# Patient Record
Sex: Female | Born: 1990 | Hispanic: No | Marital: Married | Smoking: Never smoker
Health system: Southern US, Community
[De-identification: ages and names within clinical notes are randomized; demographics above are authoritative.]

## PROBLEM LIST (undated history)

## (undated) ENCOUNTER — Inpatient Hospital Stay (HOSPITAL_COMMUNITY): Payer: Self-pay

## (undated) DIAGNOSIS — O139 Gestational [pregnancy-induced] hypertension without significant proteinuria, unspecified trimester: Secondary | ICD-10-CM

## (undated) DIAGNOSIS — F329 Major depressive disorder, single episode, unspecified: Secondary | ICD-10-CM

## (undated) HISTORY — PX: NO PAST SURGERIES: SHX2092

---

## 2014-03-04 ENCOUNTER — Ambulatory Visit (INDEPENDENT_AMBULATORY_CARE_PROVIDER_SITE_OTHER): Payer: Medicaid Other | Admitting: Family Medicine

## 2014-03-04 DIAGNOSIS — F32A Depression, unspecified: Secondary | ICD-10-CM

## 2014-03-04 DIAGNOSIS — R51 Headache: Secondary | ICD-10-CM

## 2014-03-04 DIAGNOSIS — K5909 Other constipation: Secondary | ICD-10-CM

## 2014-03-04 DIAGNOSIS — Z30013 Encounter for initial prescription of injectable contraceptive: Secondary | ICD-10-CM

## 2014-03-04 DIAGNOSIS — Z0289 Encounter for other administrative examinations: Secondary | ICD-10-CM

## 2014-03-04 DIAGNOSIS — F329 Major depressive disorder, single episode, unspecified: Secondary | ICD-10-CM

## 2014-03-04 DIAGNOSIS — Z308 Encounter for other contraceptive management: Secondary | ICD-10-CM

## 2014-03-04 DIAGNOSIS — R519 Headache, unspecified: Secondary | ICD-10-CM

## 2014-03-04 DIAGNOSIS — Z008 Encounter for other general examination: Secondary | ICD-10-CM

## 2014-03-04 DIAGNOSIS — K219 Gastro-esophageal reflux disease without esophagitis: Secondary | ICD-10-CM

## 2014-03-04 LAB — POCT URINE PREGNANCY: Preg Test, Ur: NEGATIVE

## 2014-03-04 MED ORDER — OMEPRAZOLE 40 MG PO CPDR: 40.0000 mg | DELAYED_RELEASE_CAPSULE | Freq: Every day | ORAL | Status: DC

## 2014-03-04 MED ORDER — DOCUSATE SODIUM 100 MG PO CAPS: 100.0000 mg | ORAL_CAPSULE | Freq: Two times a day (BID) | ORAL | Status: DC

## 2014-03-04 MED ORDER — ACETAMINOPHEN 500 MG PO TABS: 500.0000 mg | ORAL_TABLET | Freq: Four times a day (QID) | ORAL | Status: DC | PRN

## 2014-03-04 NOTE — Progress Notes (Signed)
   [facility] New Patient Visit  HPI:  [facility] for a new patient appointment to establish general primary care.  See problem list for other details  ROS: See HPI  Immigrant Social History: - Name spelling correct?:  - Date arrived in [place]: 14 December 2013.   - Country of origin: [place] - Location of refugee camp (if applicable), how long there, and what caused patient to leave home country?: camp in [place] - Primary language:  Speaks English - Education: Highest level of education: WriterBachelor's Information Technology - Prior work: fled to [place] as soon as she finished school - Tobacco/alcohol/drug use: none - Marriage Status: none - Sexual activity: vaginal, uses condoms - Declined discussion about reasons for fleeing [place].  Denies any PTSD symptoms.    Preventative Care History: -Seen at health department?: no  Past Medical Hx:  -denies  Past Surgical Hx:  -denies  Family Hx: updated in Epic No family members here in [place]  PHYSICAL EXAM:  Gen: Well NAD HEENT: EOMI,  MMM Lungs: CTABL Nl WOB Heart: RRR no MRG Abd:  BS good.  Some mild TTP LLQ and suprapubic Exts: Non edematous BL  LE, warm and well perfused.  Psych:  Not depressed or anxious appearing.  Linear and coherent thought process as evidenced by speech pattern. Smiles spontaneously.  Neuro:  Alert and oriented to person, place, and date.  CN II-XII intact.  No focal deficits noted.

## 2014-03-04 NOTE — Patient Instructions (Signed)
Call to make a lab appointment on Tuesday of next week.  Take the Colace twice daily for your stomach.  Take the Omeprazole once a day for your stomach.  Take the Acetaminophen for headaches every 6 hours.  I'll see you back in 4 weeks.

## 2014-03-05 DIAGNOSIS — K5900 Constipation, unspecified: Secondary | ICD-10-CM | POA: Insufficient documentation

## 2014-03-05 DIAGNOSIS — F329 Major depressive disorder, single episode, unspecified: Secondary | ICD-10-CM | POA: Insufficient documentation

## 2014-03-05 DIAGNOSIS — Z309 Encounter for contraceptive management, unspecified: Secondary | ICD-10-CM | POA: Insufficient documentation

## 2014-03-05 DIAGNOSIS — F32A Depression, unspecified: Secondary | ICD-10-CM | POA: Insufficient documentation

## 2014-03-05 DIAGNOSIS — K219 Gastro-esophageal reflux disease without esophagitis: Secondary | ICD-10-CM | POA: Insufficient documentation

## 2014-03-05 DIAGNOSIS — R51 Headache: Secondary | ICD-10-CM

## 2014-03-05 DIAGNOSIS — R519 Headache, unspecified: Secondary | ICD-10-CM | POA: Insufficient documentation

## 2014-03-05 DIAGNOSIS — Z0289 Encounter for other administrative examinations: Secondary | ICD-10-CM | POA: Insufficient documentation

## 2014-03-05 HISTORY — DX: Depression, unspecified: F32.A

## 2014-03-05 NOTE — Assessment & Plan Note (Signed)
CHronic problem "for years."  Describes stool every 1-3 days, hard, small Drinks "some water" throughout the day. Never treated. Colace for now.  FU 1 month

## 2014-03-05 NOTE — Assessment & Plan Note (Signed)
Long-standing. DOesn't know triggers, except some spicy foods. No N/V No weight loss Trial of PPI

## 2014-03-05 NOTE — Assessment & Plan Note (Signed)
Previously received Depo in [place].   This has expired.  Denies any sexual activity in past 3 months (since arrival).  Pregnancy test and Depo today.

## 2014-03-05 NOTE — Assessment & Plan Note (Signed)
Lab already closed.  To call and return next week (she prefers Tues) for lab appt for refugee labs.  I will put these in as future orders

## 2014-03-05 NOTE — Assessment & Plan Note (Signed)
Endorses some mild depression since arrival in [place]. No family members here. Only support is her roommate whom she met after arrival, also from [place].  Denies SI/HI. Declines medicines, but wants to discuss counseling options at future appt

## 2014-03-05 NOTE — Assessment & Plan Note (Signed)
Occasional problem, usually exacerbated by constipation.  "Band" like headache, No N/V.  Every 1-2 weeks Has not OTC meds for this, inquires about them.  ACetaminophen PRN

## 2014-03-08 ENCOUNTER — Ambulatory Visit (INDEPENDENT_AMBULATORY_CARE_PROVIDER_SITE_OTHER): Payer: Medicaid Other | Admitting: *Deleted

## 2014-03-08 ENCOUNTER — Other Ambulatory Visit (INDEPENDENT_AMBULATORY_CARE_PROVIDER_SITE_OTHER): Payer: Medicaid Other

## 2014-03-08 DIAGNOSIS — Z0289 Encounter for other administrative examinations: Secondary | ICD-10-CM

## 2014-03-08 DIAGNOSIS — Z23 Encounter for immunization: Secondary | ICD-10-CM

## 2014-03-08 DIAGNOSIS — Z008 Encounter for other general examination: Secondary | ICD-10-CM

## 2014-03-08 LAB — CBC WITH DIFFERENTIAL/PLATELET
BASOS PCT: 0 % (ref 0–1)
Basophils Absolute: 0 10*3/uL (ref 0.0–0.1)
Eosinophils Absolute: 0.2 10*3/uL (ref 0.0–0.7)
Eosinophils Relative: 4 % (ref 0–5)
HEMATOCRIT: 43.1 % (ref 36.0–46.0)
Hemoglobin: 14.6 g/dL (ref 12.0–15.0)
Lymphocytes Relative: 53 % — ABNORMAL HIGH (ref 12–46)
Lymphs Abs: 2.7 10*3/uL (ref 0.7–4.0)
MCH: 29.3 pg (ref 26.0–34.0)
MCHC: 33.9 g/dL (ref 30.0–36.0)
MCV: 86.4 fL (ref 78.0–100.0)
MONO ABS: 0.4 10*3/uL (ref 0.1–1.0)
MPV: 9.6 fL (ref 9.4–12.4)
Monocytes Relative: 7 % (ref 3–12)
Neutro Abs: 1.8 10*3/uL (ref 1.7–7.7)
Neutrophils Relative %: 36 % — ABNORMAL LOW (ref 43–77)
Platelets: 321 10*3/uL (ref 150–400)
RBC: 4.99 MIL/uL (ref 3.87–5.11)
RDW: 13.4 % (ref 11.5–15.5)
WBC: 5.1 10*3/uL (ref 4.0–10.5)

## 2014-03-08 LAB — POCT URINALYSIS DIPSTICK
Bilirubin, UA: NEGATIVE
Glucose, UA: NEGATIVE
KETONES UA: NEGATIVE
Leukocytes, UA: NEGATIVE
Nitrite, UA: NEGATIVE
PH UA: 5.5
PROTEIN UA: NEGATIVE
RBC UA: NEGATIVE
Spec Grav, UA: 1.015
Urobilinogen, UA: 0.2

## 2014-03-08 LAB — COMPREHENSIVE METABOLIC PANEL
ALBUMIN: 4.4 g/dL (ref 3.5–5.2)
ALT: 15 U/L (ref 0–35)
AST: 22 U/L (ref 0–37)
Alkaline Phosphatase: 46 U/L (ref 39–117)
BUN: 14 mg/dL (ref 6–23)
CO2: 23 mEq/L (ref 19–32)
Calcium: 9.4 mg/dL (ref 8.4–10.5)
Chloride: 105 mEq/L (ref 96–112)
Creat: 0.86 mg/dL (ref 0.50–1.10)
GLUCOSE: 71 mg/dL (ref 70–99)
POTASSIUM: 4 meq/L (ref 3.5–5.3)
Sodium: 139 mEq/L (ref 135–145)
TOTAL PROTEIN: 7.5 g/dL (ref 6.0–8.3)
Total Bilirubin: 0.4 mg/dL (ref 0.2–1.2)

## 2014-03-08 NOTE — Addendum Note (Signed)
Addended by: Swaz[place]andJORDAN, Sheriann Newmann on: 03/08/2014 10:53 AM   Modules accepted: Orders

## 2014-03-08 NOTE — Progress Notes (Unsigned)
LABS DONE TODAY Ulonda Klosowski 

## 2014-03-09 LAB — VARICELLA ZOSTER ANTIBODY, IGG: VARICELLA IGG: 1887 {index} — AB (ref <135.00)

## 2014-03-09 LAB — SICKLE CELL SCREEN: Sickle Cell Screen: POSITIVE — AB

## 2014-03-09 LAB — HIV ANTIBODY (ROUTINE TESTING W REFLEX): HIV: NONREACTIVE

## 2014-03-09 LAB — HEPATITIS B SURFACE ANTIGEN: Hepatitis B Surface Ag: NEGATIVE

## 2014-03-09 LAB — RPR

## 2014-03-12 LAB — QUANTIFERON TB GOLD ASSAY (BLOOD)
Interferon Gamma Release Assay: NEGATIVE
Mitogen value: 2.31 IU/mL
Quantiferon Nil Value: 0.04 IU/mL
Quantiferon Tb Ag Minus Nil Value: 0.27 IU/mL
TB Ag value: 0.31 IU/mL

## 2014-06-03 ENCOUNTER — Other Ambulatory Visit (HOSPITAL_COMMUNITY)
Admission: RE | Admit: 2014-06-03 | Discharge: 2014-06-03 | Disposition: A | Discharge status: Home / Self Care | Payer: Medicaid Other | Source: Ambulatory Visit | Attending: Family Medicine | Admitting: Family Medicine

## 2014-06-03 ENCOUNTER — Encounter: Payer: Self-pay | Admitting: Family Medicine

## 2014-06-03 ENCOUNTER — Ambulatory Visit (INDEPENDENT_AMBULATORY_CARE_PROVIDER_SITE_OTHER): Payer: Medicaid Other | Admitting: Family Medicine

## 2014-06-03 VITALS — BP 137/69 | HR 86 | Temp 98.3°F | Ht 61.5 in | Wt 133.2 lb

## 2014-06-03 DIAGNOSIS — Z113 Encounter for screening for infections with a predominantly sexual mode of transmission: Secondary | ICD-10-CM | POA: Insufficient documentation

## 2014-06-03 DIAGNOSIS — N760 Acute vaginitis: Secondary | ICD-10-CM

## 2014-06-03 DIAGNOSIS — Z0000 Encounter for general adult medical examination without abnormal findings: Secondary | ICD-10-CM

## 2014-06-03 DIAGNOSIS — N898 Other specified noninflammatory disorders of vagina: Secondary | ICD-10-CM

## 2014-06-03 DIAGNOSIS — Z308 Encounter for other contraceptive management: Secondary | ICD-10-CM

## 2014-06-03 LAB — POCT WET PREP (WET MOUNT): CLUE CELLS WET PREP WHIFF POC: NEGATIVE

## 2014-06-03 LAB — POCT URINE PREGNANCY: PREG TEST UR: NEGATIVE

## 2014-06-03 MED ORDER — MEDROXYPROGESTERONE ACETATE 150 MG/ML IM SUSP
150.0000 mg | Freq: Once | INTRAMUSCULAR | Status: AC
  Administered 2014-06-03: 150 mg via INTRAMUSCULAR

## 2014-06-03 NOTE — Patient Instructions (Signed)
I will call you with the results this afternoon.  I will ask about jobs.   I will refer you to a woman doctor.

## 2014-06-03 NOTE — Progress Notes (Signed)
Subjective:    Lauren BartersSalmah Douglas is a 24 y.o. female who [facility]:  1.  Vaginal discharge:  Started about a week ago.  Describes as foul odor and itching.  Does not know what she should try for relief.  Practices safe sex with condoms. No abdominal pain or dysuria.   Also asking for refill for Depo  2.  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  Currently in pre-req courses.      ROS as above per HPI, otherwise neg.  The following portions of the patient's history were reviewed and updated as appropriate: allergies, current medications, past medical history, family and social history, and problem list. Patient is a nonsmoker.    PMH reviewed.  No past medical history on file. No past surgical history on file.  Medications reviewed. Current Outpatient Prescriptions  Medication Sig Dispense Refill  . acetaminophen (TYLENOL) 500 MG tablet Take 1 tablet (500 mg total) by mouth every 6 (six) hours as needed. 30 tablet 0  . docusate sodium (COLACE) 100 MG capsule Take 1 capsule (100 mg total) by mouth 2 (two) times daily. 10 capsule 0  . omeprazole (PRILOSEC) 40 MG capsule Take 1 capsule (40 mg total) by mouth daily. 30 capsule 1   No current facility-administered medications for this visit.     Objective:   Physical Exam BP 137/69 mmHg  Pulse 86  Temp(Src) 98.3 F (36.8 C) (Oral)  Ht 5' 1.5" (1.562 m)  Wt 133 lb 3.2 oz (60.419 kg)  BMI 24.76 kg/m2  LMP 05/27/2014 Gen:  Alert, cooperative patient who appears stated age in no acute distress.  Vital signs reviewed. HEENT: EOMI,  MMM Cardiac:  Regular rate and rhythm without murmur auscultated.  Good S1/S2. Pulm:  Clear to auscultation bilaterally with good air movement.  No wheezes or rales noted.   Abd:  Soft/nondistended/nontender.  Good bowel sounds throughout all four quadrants.  No masses noted.  GU:  Declined GU  exam.  Performed self-administered wet prep.  No results found for this or any previous visit (from the past 72 hour(s)).

## 2014-06-05 DIAGNOSIS — Z0000 Encounter for general adult medical examination without abnormal findings: Secondary | ICD-10-CM | POA: Insufficient documentation

## 2014-06-05 DIAGNOSIS — N760 Acute vaginitis: Secondary | ICD-10-CM | POA: Insufficient documentation

## 2014-06-05 NOTE — Assessment & Plan Note (Signed)
Desires referral to GYN for pap smear/women's health.  Will place today.

## 2014-06-05 NOTE — Assessment & Plan Note (Signed)
Negative pregnancy test here Depo administered

## 2014-06-05 NOTE — Assessment & Plan Note (Signed)
Negative wet prep -- but declined exam  Sounds like BV.   Empiric treatment with Flagyl FU in 2 weeks if no improvement or sooner if worsening.

## 2014-06-06 ENCOUNTER — Telehealth: Payer: Self-pay | Admitting: Family Medicine

## 2014-06-06 LAB — URINE CYTOLOGY ANCILLARY ONLY
Chlamydia: NEGATIVE
Neisseria Gonorrhea: NEGATIVE

## 2014-06-06 MED ORDER — METRONIDAZOLE 500 MG PO TABS: 500.0000 mg | ORAL_TABLET | Freq: Two times a day (BID) | ORAL | Status: DC

## 2014-06-06 NOTE — Telephone Encounter (Signed)
Called and discussed negative testing with patient.  Including negative GC/Chlamydia.  Patient relieved.  However this doesn't explain her foul odor and discharge.  Patient refused pelvic exam and opted for self wet-prep, which may have resulted in false negative testing.  Will empirically treat with Flagyl.  Recommended not to drink alcohol with this.  Patient appreciative.

## 2014-08-22 ENCOUNTER — Ambulatory Visit: Payer: Medicaid Other

## 2014-08-29 ENCOUNTER — Other Ambulatory Visit: Payer: Self-pay | Admitting: Infectious Disease

## 2014-08-29 ENCOUNTER — Ambulatory Visit
Admission: RE | Admit: 2014-08-29 | Discharge: 2014-08-29 | Disposition: A | Discharge status: Home / Self Care | Payer: No Typology Code available for payment source | Source: Ambulatory Visit | Attending: Infectious Disease | Admitting: Infectious Disease

## 2014-08-29 DIAGNOSIS — Z111 Encounter for screening for respiratory tuberculosis: Secondary | ICD-10-CM

## 2015-01-10 ENCOUNTER — Encounter: Payer: Self-pay | Admitting: Family Medicine

## 2015-01-10 ENCOUNTER — Other Ambulatory Visit (HOSPITAL_COMMUNITY)
Admission: RE | Admit: 2015-01-10 | Discharge: 2015-01-10 | Disposition: A | Discharge status: Home / Self Care | Payer: 59 | Source: Ambulatory Visit | Attending: Family Medicine | Admitting: Family Medicine

## 2015-01-10 ENCOUNTER — Ambulatory Visit (INDEPENDENT_AMBULATORY_CARE_PROVIDER_SITE_OTHER): Payer: 59 | Admitting: Family Medicine

## 2015-01-10 VITALS — BP 129/85 | HR 87 | Temp 98.6°F | Ht 61.5 in | Wt 133.1 lb

## 2015-01-10 DIAGNOSIS — R109 Unspecified abdominal pain: Secondary | ICD-10-CM | POA: Diagnosis not present

## 2015-01-10 DIAGNOSIS — Z113 Encounter for screening for infections with a predominantly sexual mode of transmission: Secondary | ICD-10-CM | POA: Diagnosis not present

## 2015-01-10 DIAGNOSIS — Z23 Encounter for immunization: Secondary | ICD-10-CM | POA: Diagnosis not present

## 2015-01-10 DIAGNOSIS — R1030 Lower abdominal pain, unspecified: Secondary | ICD-10-CM | POA: Diagnosis not present

## 2015-01-10 LAB — POCT WET PREP (WET MOUNT): Clue Cells Wet Prep Whiff POC: POSITIVE

## 2015-01-10 LAB — POCT UA - MICROSCOPIC ONLY

## 2015-01-10 LAB — POCT URINALYSIS DIPSTICK
Bilirubin, UA: NEGATIVE
GLUCOSE UA: NEGATIVE
KETONES UA: NEGATIVE
Leukocytes, UA: NEGATIVE
Nitrite, UA: NEGATIVE
Protein, UA: NEGATIVE
SPEC GRAV UA: 1.015
UROBILINOGEN UA: 0.2
pH, UA: 6

## 2015-01-10 LAB — HIV ANTIBODY (ROUTINE TESTING W REFLEX): HIV 1&2 Ab, 4th Generation: NONREACTIVE

## 2015-01-10 LAB — POCT URINE PREGNANCY: Preg Test, Ur: NEGATIVE

## 2015-01-10 MED ORDER — CEFTRIAXONE SODIUM 1 G IJ SOLR
250.0000 mg | Freq: Once | INTRAMUSCULAR | Status: AC
  Administered 2015-01-10: 250 mg via INTRAMUSCULAR

## 2015-01-10 MED ORDER — CEFTRIAXONE SODIUM 250 MG IJ SOLR
250.0000 mg | Freq: Once | INTRAMUSCULAR | Status: DC
Start: 2015-01-10 — End: 2015-01-10

## 2015-01-10 NOTE — Progress Notes (Signed)
Subjective:    Lauren BartersSalmah Douglas is a 24 y.o. female who [facility], but her abdominal pain precluded full physical:  1.  Abdominal pain:  Started about 1.5 weeks ago, but worsened this past weekend (last 4 days). Describes sharp stabbing pain in lower abdomen. Last real LMP was in August.  Missed September period, had Oct 1 period but much lighter than usual.  + nausea with some vomiting x 2 times two days ago.  No diarrhea.  Has constipation but passing flatus.  Not affected by food.  Had milk and bread today.  Has vaginal discharge.  Also some dysuria.  Subjective fevers at home.  Unprotected sexual intercourse several times in past several weeks.    Prev health:  Needs flu shot.   ROS as above per HPI, otherwise neg.   The following portions of the patient's history were reviewed and updated as appropriate: allergies, current medications, past medical history, family and social history, and problem list. Patient is a nonsmoker.    PMH reviewed.  No past medical history on file. No past surgical history on file.  Medications reviewed. Current Outpatient Prescriptions  Medication Sig Dispense Refill  . acetaminophen (TYLENOL) 500 MG tablet Take 1 tablet (500 mg total) by mouth every 6 (six) hours as needed. 30 tablet 0  . docusate sodium (COLACE) 100 MG capsule Take 1 capsule (100 mg total) by mouth 2 (two) times daily. 10 capsule 0  . metroNIDAZOLE (FLAGYL) 500 MG tablet Take 1 tablet (500 mg total) by mouth 2 (two) times daily. 14 tablet 0  . omeprazole (PRILOSEC) 40 MG capsule Take 1 capsule (40 mg total) by mouth daily. 30 capsule 1   No current facility-administered medications for this visit.     Objective:   Physical Exam BP 129/85 mmHg  Pulse 87  Temp(Src) 98.6 F (37 C) (Oral)  Ht 5' 1.5" (1.562 m)  Wt 133 lb 1.6 oz (60.374 kg)  BMI 24.75 kg/m2 Gen:  Alert, cooperative patient who appears stated age in no acute distress.  Sitting in exam chair  beside exam table. Vital signs reviewed.  Appears uncomfortable but not toxic.  HEENT: EOMI,  MMM Cardiac:  Regular rate and rhythm  Pulm:  Clear to auscultation bilaterally with good air movement.  No wheezes or rales noted.   Abd:  Soft/nondistended.  TTP mostly suprapubic, but some Left lower quadrant as well.  No guarding or rebound GYN:  External genitalia within normal limits.  Vaginal mucosa pink, moist, normal rugae.  Nonfriable cervix with thick, dark discharge noted from os.  Uncomfortable speculum exam for patient.  Bimanual exam revealed normal, nongravid uterus with marked cervical motion tenderness. No adnexal masses bilaterally.   Exts: Non edematous BL  LE, warm and well perfused.   No results found for this or any previous visit (from the past 72 hour(s)).

## 2015-01-11 DIAGNOSIS — R109 Unspecified abdominal pain: Secondary | ICD-10-CM | POA: Insufficient documentation

## 2015-01-11 LAB — CERVICOVAGINAL ANCILLARY ONLY
CHLAMYDIA, DNA PROBE: NEGATIVE
Neisseria Gonorrhea: NEGATIVE

## 2015-01-11 NOTE — Patient Instructions (Signed)
Come back to see me or another provider in 2 days.  This is very important.

## 2015-01-11 NOTE — Assessment & Plan Note (Signed)
Presumed PID. No fevers currently. She has not taken any antipyretics which would reduce a fever. Exam consistent with PID with thick, discolored discharge from os. No guarding or rebound on her abdomen. I have a low suspicion for surgical abdomen in light of GYN exam, abdominal exam, lack of fever. Treated here with 250 g Rocephin and 1 g azithromycin. She is to follow-up in 2 days for reassessment and to ensure she is improving. Wet prep and GC chlamydia obtained today.

## 2015-01-13 ENCOUNTER — Encounter (HOSPITAL_COMMUNITY): Payer: Self-pay

## 2015-01-13 ENCOUNTER — Ambulatory Visit (HOSPITAL_COMMUNITY)
Admission: RE | Admit: 2015-01-13 | Discharge: 2015-01-13 | Disposition: A | Discharge status: Home / Self Care | Payer: 59 | Source: Ambulatory Visit | Attending: Family Medicine | Admitting: Family Medicine

## 2015-01-13 ENCOUNTER — Ambulatory Visit (INDEPENDENT_AMBULATORY_CARE_PROVIDER_SITE_OTHER): Payer: 59 | Admitting: Family Medicine

## 2015-01-13 ENCOUNTER — Other Ambulatory Visit: Payer: Self-pay | Admitting: Family Medicine

## 2015-01-13 VITALS — BP 110/68 | HR 92 | Temp 98.3°F | Wt 132.6 lb

## 2015-01-13 DIAGNOSIS — A499 Bacterial infection, unspecified: Secondary | ICD-10-CM

## 2015-01-13 DIAGNOSIS — B9689 Other specified bacterial agents as the cause of diseases classified elsewhere: Secondary | ICD-10-CM

## 2015-01-13 DIAGNOSIS — N760 Acute vaginitis: Secondary | ICD-10-CM

## 2015-01-13 DIAGNOSIS — Z3200 Encounter for pregnancy test, result unknown: Secondary | ICD-10-CM

## 2015-01-13 DIAGNOSIS — R1030 Lower abdominal pain, unspecified: Secondary | ICD-10-CM | POA: Insufficient documentation

## 2015-01-13 LAB — COMPREHENSIVE METABOLIC PANEL
ALK PHOS: 48 U/L (ref 38–126)
ALT: 35 U/L (ref 14–54)
AST: 39 U/L (ref 15–41)
Albumin: 3.9 g/dL (ref 3.5–5.0)
Anion gap: 8 (ref 5–15)
BUN: 7 mg/dL (ref 6–20)
CHLORIDE: 100 mmol/L — AB (ref 101–111)
CO2: 26 mmol/L (ref 22–32)
Calcium: 9.1 mg/dL (ref 8.9–10.3)
Creatinine, Ser: 0.68 mg/dL (ref 0.44–1.00)
Glucose, Bld: 65 mg/dL (ref 65–99)
Potassium: 4.1 mmol/L (ref 3.5–5.1)
SODIUM: 134 mmol/L — AB (ref 135–145)
Total Bilirubin: 0.7 mg/dL (ref 0.3–1.2)
Total Protein: 7 g/dL (ref 6.5–8.1)

## 2015-01-13 LAB — CBC WITH DIFFERENTIAL/PLATELET
BASOS ABS: 0 10*3/uL (ref 0.0–0.1)
Basophils Relative: 0 %
Eosinophils Absolute: 0.3 10*3/uL (ref 0.0–0.7)
Eosinophils Relative: 6 %
HCT: 40.4 % (ref 36.0–46.0)
HEMOGLOBIN: 14 g/dL (ref 12.0–15.0)
LYMPHS ABS: 1.9 10*3/uL (ref 0.7–4.0)
LYMPHS PCT: 44 %
MCH: 29.5 pg (ref 26.0–34.0)
MCHC: 34.7 g/dL (ref 30.0–36.0)
MCV: 85.1 fL (ref 78.0–100.0)
Monocytes Absolute: 0.3 10*3/uL (ref 0.1–1.0)
Monocytes Relative: 7 %
NEUTROS ABS: 1.8 10*3/uL (ref 1.7–7.7)
NEUTROS PCT: 43 %
PLATELETS: 278 10*3/uL (ref 150–400)
RBC: 4.75 MIL/uL (ref 3.87–5.11)
RDW: 12.1 % (ref 11.5–15.5)
WBC: 4.3 10*3/uL (ref 4.0–10.5)

## 2015-01-13 LAB — PREGNANCY, URINE: Preg Test, Ur: NEGATIVE

## 2015-01-13 MED ORDER — METRONIDAZOLE 500 MG PO TABS: 500.0000 mg | ORAL_TABLET | Freq: Two times a day (BID) | ORAL | Status: DC

## 2015-01-13 MED ORDER — IOHEXOL 300 MG/ML  SOLN: 100.0000 mL | Freq: Once | INTRAMUSCULAR | Status: DC | PRN

## 2015-01-13 MED ORDER — IOHEXOL 300 MG/ML  SOLN
100.0000 mL | Freq: Once | INTRAMUSCULAR | Status: AC | PRN
  Administered 2015-01-13: 100 mL via INTRAVENOUS

## 2015-01-13 NOTE — Progress Notes (Signed)
    Subjective   Uldine Bujak is a 24 y.o. female that presents for a same day visit  1. PID: Symptoms are generally better but still has pain when she is sitting down. She feels diaphoretic but says she's not sweating. She has vomited this morning after drinking fluids. No fevers or chills. She has a history of diarrhea for the past two days as well.  ROS Per HPI  Social History  Substance Use Topics  . Smoking status: Never Smoker   . Smokeless tobacco: Not on file  . Alcohol Use: Not on file    No Known Allergies  Objective   BP 110/68 mmHg  Pulse 92  Temp(Src) 98.3 F (36.8 C) (Oral)  Wt 132 lb 9.6 oz (60.147 kg)  SpO2 99%  LMP 12/21/2014  General: Fair appearing, no distress Gastrointestinal: Soft, tenderness in suprapubic, periumbilical, RLQ, RUQ and epigastric areas. No rebound or guarding.  Assessment and Plan   Orders Placed This Encounter  Procedures  . CT Abdomen Pelvis Wo Contrast    Standing Status: Future     Number of Occurrences:      Standing Expiration Date: 04/14/2016    Order Specific Question:  Reason for Exam (SYMPTOM  OR DIAGNOSIS REQUIRED)    Answer:  abdominal pain    Order Specific Question:  Is the patient pregnant?    Answer:  No    Order Specific Question:  Preferred imaging location?    [facility]  . CT Abdomen Pelvis W Contrast    Standing Status: Future     Number of Occurrences: 1     Standing Expiration Date: 04/14/2016    Order Specific Question:  If indicated for the ordered procedure, I authorize the administration of contrast media per Radiology protocol    Answer:  Yes    Order Specific Question:  Reason for Exam (SYMPTOM  OR DIAGNOSIS REQUIRED)    Answer:  Abdominal pain. ?PID    Order Specific Question:  Is the patient pregnant?    Answer:  No    Order Specific Question:  Preferred imaging location?    Answer[facility]  . Lipase  . CBC with Differential  . Comprehensive metabolic  panel    Meds ordered this encounter  Medications  . metroNIDAZOLE (FLAGYL) 500 MG tablet    Sig: Take 1 tablet (500 mg total) by mouth 2 (two) times daily.    Dispense:  14 tablet    Refill:  0    PID: appears worsening per patient complaints and physical exam. Discussed with Dr. Gwendolyn GrantWalden  Stat CMP, Lipase and CBC  Stat CT abdomen/pelvis  Follow-up in 3 days, sooner if symptoms worsen

## 2015-01-13 NOTE — Patient Instructions (Addendum)
Thank you for coming to see me today. It was a pleasure. Today we talked about:   Abdominal pain: I am prescribing metronidazole 500mg  twice a day for 7 days. I am also getting some lab work and an x-ray of your stomach. Depending on what the labs and x-ray show, you may be called to come to the hospital.  Please make an appointment for follow-up on Monday 10/31  If you have any questions or concerns, please do not hesitate to call the office at [phone].  Sincerely,  Jacquelin Hawkingalph Danh Bayus, MD

## 2015-01-18 ENCOUNTER — Encounter: Payer: Self-pay | Admitting: Family Medicine

## 2015-01-18 ENCOUNTER — Ambulatory Visit (INDEPENDENT_AMBULATORY_CARE_PROVIDER_SITE_OTHER): Payer: 59 | Admitting: Family Medicine

## 2015-01-18 VITALS — BP 114/83 | HR 79 | Temp 98.7°F | Ht 61.5 in | Wt 135.0 lb

## 2015-01-18 DIAGNOSIS — R1030 Lower abdominal pain, unspecified: Secondary | ICD-10-CM | POA: Diagnosis not present

## 2015-01-18 MED ORDER — IBUPROFEN 600 MG PO TABS: 600.0000 mg | ORAL_TABLET | Freq: Three times a day (TID) | ORAL | Status: DC | PRN

## 2015-01-18 NOTE — Assessment & Plan Note (Addendum)
Pain likely from ovarian hemorrhagic cyst based on CT results from 10/28. Chlamydia and Gonorrhea tests negative. Will prescribe Ibuprofen for pain. Patient instructed to follow up with Dr. Gwendolyn GrantWalden in 6 weeks. May need pelvic ultrasound at that time. Patient was encouraged to pick up Flagyl prescription that Dr. Caleb PoppNettey sent and take medication. Return precautions were given.

## 2015-01-18 NOTE — Patient Instructions (Signed)
Thank you for coming in today, it was nice meeting you! Today we talked about your pelvic pain. You will need to come back in 6 weeks to see Dr. Gwendolyn GrantWalden for your pain. I will send over a prescription for Ibuprofen for pain. You may have irregular periods because of the cyst on your ovary.  Please come back to the clinic or go to the emergency department if your pain becomes worse or if you develop fever.   If you have any questions or concerns, please do not hesitate to call the office at [phone].  Sincerely,  Anders Simmondshristina Malavika Lira, MD

## 2015-01-18 NOTE — Progress Notes (Signed)
Subjective:    Patient ID: Lauren Douglas , female   DOB: [birth year] , 24 y.o..   MRN: [id number]  HPI  Cassiopeia Florentino is here for follow up for pelvic pain. Patient was seen on 10/26 by Dr. Gwendolyn Grant and was suspected to have PID. She was treated with 250 g Rocephin and 1 g Azithromycin. Results later came back that patient was negative for Chlamydia and Gonorrhea. Pregnancy test negative. Wet prep showed many clue cells and moderate bacteria. She was prescribed Flagyl but never picked up the medication from the pharmacy. She was seen again on 10/28 by Dr. Caleb Popp because she still had some pelvic pain. CT scan of pelvis was performed at [facility] which revealed a 2.4 cm cystic lesion likely involving the left ovary. Patient still has some pelvic pain which is aggravated by sitting down and with sex. She is unable to describe what the pain feels like but rates is 10/10. Pain is intermittent. Patient admits to some white mucous discharge, no blood. Has had subjective fevers. Additionally, she was supposed to get her period today but it has not come yet.   Review of Systems: Per HPI. All other systems reviewed and are negative.  Health Maintenance Due  Topic Date Due  . PAP SMEAR  01/13/2012    Past Medical History: Patient Active Problem List   Diagnosis Date Noted  . Abdominal pain 01/11/2015  . Vaginitis and vulvovaginitis 06/05/2014  . Preventative health care 06/05/2014  . Depression 03/05/2014  . Constipation 03/05/2014  . Headache 03/05/2014  . GERD (gastroesophageal reflux disease) 03/05/2014  . Contraception management 03/05/2014  . Refugee health examination 03/05/2014    Medications: reviewed and updated Current Outpatient Prescriptions  Medication Sig Dispense Refill  . acetaminophen (TYLENOL) 500 MG tablet Take 1 tablet (500 mg total) by mouth every 6 (six) hours as needed. 30 tablet 0  . docusate sodium (COLACE) 100 MG capsule Take 1 capsule (100 mg total) by mouth  2 (two) times daily. 10 capsule 0  . metroNIDAZOLE (FLAGYL) 500 MG tablet Take 1 tablet (500 mg total) by mouth 2 (two) times daily. 14 tablet 0  . omeprazole (PRILOSEC) 40 MG capsule Take 1 capsule (40 mg total) by mouth daily. 30 capsule 1   No current facility-administered medications for this visit.     reports that she has never smoked. She does not have any smokeless tobacco history on file.    Objective:   LMP 12/21/2014 Physical Exam  Gen: NAD, alert, cooperative with exam, well-appearing CV: RRR, good S1/S2, no murmur, no edema, capillary refill brisk  Resp: CTABL, no wheezes, non-labored Abd: soft, BS present, pain upon palpation of right lower quadrant and suprapubic Skin: no rashes, normal turgor  Psych: good insight, alert and oriented  Ct Abdomen Pelvis W Contrast IMPRESSION: 2.4 cm cystic lesion likely involving the left ovary, with physiologic free fluid in the cul-de-sac and subtle hazy attenuation in the left cul-de-sac suggesting the possibility of inflammation. The findings would be consistent with ruptured hemorrhagic ovarian cyst. The possibility of ovarian abscess associated with pelvic inflammatory disease is not entirely excluded on the imaging findings alone. If based on clinical and laboratory assessment there is concern for pelvic inflammatory disease, consider pelvic ultrasound to further evaluate. Mild diffuse bladder wall thickening, of uncertain significance. This may be within limits of physiologic variability for this patient. This can also be seen with cystitis. Electronically Signed   By: Esperanza Heir M.D.   On:  01/13/2015 21:39   10/25 Microscopic wet-mount exam shows clue cells, and bacteria.   Assessment & Plan:  Abdominal pain Pain likely from ovarian hemorrhagic cyst based on CT results from 10/28. Chlamydia and Gonorrhea tests negative. Will prescribe Ibuprofen for pain. Patient instructed to follow up with Dr. Gwendolyn GrantWalden in 6 weeks. May need pelvic  ultrasound at that time. Patient was encouraged to pick up Flagyl prescription that Dr. Caleb PoppNettey sent and take medication. Return precautions were given.

## 2015-07-18 ENCOUNTER — Ambulatory Visit (INDEPENDENT_AMBULATORY_CARE_PROVIDER_SITE_OTHER): Payer: 59 | Admitting: Family Medicine

## 2015-07-18 ENCOUNTER — Other Ambulatory Visit (HOSPITAL_COMMUNITY)
Admission: RE | Admit: 2015-07-18 | Discharge: 2015-07-18 | Disposition: A | Discharge status: Home / Self Care | Payer: 59 | Source: Ambulatory Visit | Attending: Family Medicine | Admitting: Family Medicine

## 2015-07-18 ENCOUNTER — Encounter: Payer: Self-pay | Admitting: Family Medicine

## 2015-07-18 VITALS — BP 119/76 | HR 76 | Temp 98.3°F | Ht 61.5 in | Wt 135.6 lb

## 2015-07-18 DIAGNOSIS — Z308 Encounter for other contraceptive management: Secondary | ICD-10-CM

## 2015-07-18 DIAGNOSIS — Z113 Encounter for screening for infections with a predominantly sexual mode of transmission: Secondary | ICD-10-CM | POA: Diagnosis present

## 2015-07-18 DIAGNOSIS — N930 Postcoital and contact bleeding: Secondary | ICD-10-CM | POA: Diagnosis not present

## 2015-07-18 LAB — POCT URINE PREGNANCY: Preg Test, Ur: NEGATIVE

## 2015-07-18 NOTE — Assessment & Plan Note (Signed)
Wants to get pregnant. Reassured her that  can take some time after stopping Depo periods to have normal periods again.

## 2015-07-18 NOTE — Progress Notes (Signed)
Subjective:    Lauren Douglas is a 25 y.o. female who [facility] for concerns over pregnancy:  1.  Wants to get pregnant: Patient states she's been having unprotected sex for the last 11 months. She was previously on Depo-Provera for many many years.  Topped in June 2016.She does have one monogamous partner with whom she is engaged. She did have some episodes of abdominal pain last October for which she had CT abdomen showed ruptured hemorrhagic cyst. No further abdominal pain. However she has had  Some postcoital bleeding which occurs fairly regularly after sexual intercourse.  No vaginal discharge. No burning or itching. No abdominal pain.    Has occasionally had some irregular periods for past several months as well.  Usual amount of cramping.    ROS as above per HPI, otherwise neg.  Pertinently, no chest pain, palpitations, SOB, Fever, Chills, Abd pain, N/V/D.   The following portions of the patient's history were reviewed and updated as appropriate: allergies, current medications, past medical history, family and social history, and problem list. Patient is a nonsmoker.    PMH reviewed.  No past medical history on file. No past surgical history on file.  Medications reviewed. Current Outpatient Prescriptions  Medication Sig Dispense Refill  . acetaminophen (TYLENOL) 500 MG tablet Take 1 tablet (500 mg total) by mouth every 6 (six) hours as needed. 30 tablet 0  . docusate sodium (COLACE) 100 MG capsule Take 1 capsule (100 mg total) by mouth 2 (two) times daily. 10 capsule 0  . ibuprofen (ADVIL,MOTRIN) 600 MG tablet Take 1 tablet (600 mg total) by mouth every 8 (eight) hours as needed. 30 tablet 0  . metroNIDAZOLE (FLAGYL) 500 MG tablet Take 1 tablet (500 mg total) by mouth 2 (two) times daily. 14 tablet 0  . omeprazole (PRILOSEC) 40 MG capsule Take 1 capsule (40 mg total) by mouth daily. 30 capsule 1   No current facility-administered medications for this visit.      Objective:   Physical Exam BP 119/76 mmHg  Pulse 76  Temp(Src) 98.3 F (36.8 C) (Oral)  Ht 5' 1.5" (1.562 m)  Wt 135 lb 9.6 oz (61.508 kg)  BMI 25.21 kg/m2  LMP 07/16/2015 Gen:  Alert, cooperative patient who appears stated age in no acute distress.  Vital signs reviewed. HEENT: EOMI,  MMM Cardiac:  Regular rate and rhythm without murmur auscultated.  Good S1/S2. Pulm:  Clear to auscultation bilaterally with good air movement.  No wheezes or rales noted.   Abd:  Soft/nondistended/nontender.   Exts: Non edematous BL  LE, warm and well perfused.  GU:  Deferred at patient request   No results found for this or any previous visit (from the past 72 hour(s)).

## 2015-07-18 NOTE — Patient Instructions (Signed)
It was good to see you again today.  Come back and see me when you're no longer having your period and we can do a pelvic exam at that time.  We are checking your urine today. I will call and let you know what this shows.

## 2015-07-18 NOTE — Assessment & Plan Note (Addendum)
Urine probe. Checking pregnancy test today. Also check a GC chlamydia urine probe.  she does need to have a Pap smear. However she is currentlyon her menses and she declined Pap smear today. She'll make a follow-up appointment with whomever is available next week after her period has stopped to have a  Pap smear and pelvic exam.  She is concerned that if she waits to see me again, she will again be on her period.

## 2015-07-19 LAB — URINE CYTOLOGY ANCILLARY ONLY
Chlamydia: NEGATIVE
Neisseria Gonorrhea: NEGATIVE

## 2016-01-23 ENCOUNTER — Other Ambulatory Visit (HOSPITAL_COMMUNITY)
Admission: RE | Admit: 2016-01-23 | Discharge: 2016-01-23 | Disposition: A | Discharge status: Home / Self Care | Payer: 59 | Source: Ambulatory Visit | Attending: Family Medicine | Admitting: Family Medicine

## 2016-01-23 ENCOUNTER — Encounter: Payer: Self-pay | Admitting: Family Medicine

## 2016-01-23 ENCOUNTER — Ambulatory Visit (INDEPENDENT_AMBULATORY_CARE_PROVIDER_SITE_OTHER): Payer: 59 | Admitting: Family Medicine

## 2016-01-23 VITALS — BP 114/76 | HR 94 | Temp 98.6°F | Ht 61.5 in | Wt 137.6 lb

## 2016-01-23 DIAGNOSIS — Z1151 Encounter for screening for human papillomavirus (HPV): Secondary | ICD-10-CM | POA: Insufficient documentation

## 2016-01-23 DIAGNOSIS — R109 Unspecified abdominal pain: Secondary | ICD-10-CM

## 2016-01-23 DIAGNOSIS — Z124 Encounter for screening for malignant neoplasm of cervix: Secondary | ICD-10-CM | POA: Diagnosis not present

## 2016-01-23 DIAGNOSIS — Z0000 Encounter for general adult medical examination without abnormal findings: Secondary | ICD-10-CM | POA: Diagnosis not present

## 2016-01-23 DIAGNOSIS — K5909 Other constipation: Secondary | ICD-10-CM

## 2016-01-23 DIAGNOSIS — K219 Gastro-esophageal reflux disease without esophagitis: Secondary | ICD-10-CM

## 2016-01-23 DIAGNOSIS — R1030 Lower abdominal pain, unspecified: Secondary | ICD-10-CM

## 2016-01-23 DIAGNOSIS — Z01411 Encounter for gynecological examination (general) (routine) with abnormal findings: Secondary | ICD-10-CM | POA: Insufficient documentation

## 2016-01-23 LAB — POCT H PYLORI SCREEN: H PYLORI SCREEN, POC: POSITIVE

## 2016-01-23 MED ORDER — IBUPROFEN 600 MG PO TABS: 600.0000 mg | ORAL_TABLET | Freq: Three times a day (TID) | ORAL | 0 refills | Status: DC | PRN

## 2016-01-23 NOTE — Assessment & Plan Note (Signed)
Refugee from [place] and she has never been checked for H. pylori. Checking today.  She does not want to take medicine on a daily basis and therefore has not been taking a PPI like to hold off on this.

## 2016-01-23 NOTE — Assessment & Plan Note (Signed)
Much better than last visit.  Resolved after period.   No further abdominal pain except with constipation.

## 2016-01-23 NOTE — Assessment & Plan Note (Signed)
Pap smear obtained today. 

## 2016-01-23 NOTE — Assessment & Plan Note (Signed)
Patient like to hold off on any sort of medication. She lets try this naturally with prune juice. Recommended this today. She also is trying to get pregnant and recommended she start taking prenatal vitamins.

## 2016-01-23 NOTE — Patient Instructions (Signed)
For your stomach and to go to the bathroom, you can buy Prune juice.    You should also be taking prenatal vitamins if you're trying to get pregnant.   We will check your finger today to see if there is something else causing your stomach pain.

## 2016-01-23 NOTE — Progress Notes (Signed)
Subjective:    Lauren BartersSalmah Korinek is a 25 y.o. female who presents to [facility] today for constipation:  1.  Constipation:  Present for past several days.  She has not tried anything for relief of this.  States hard bowel movements and difficulty both. Has bowel movement every other day or so. No melena or hematochezia. No nausea or vomiting. Occasionally with upper abdominal pain. She is not currently taking anything for GERD or epigastric pain.  Patient was supposed to come back in May 1 week after her last visit for Pap smear and pelvic exam for pelvic pain.  However this resolved and she did not come back one week later. She has been doing well since then except for the above constipation.  Prev health:  Currently overdue for Pap smear..  ROS as above per HPI.    The following portions of the patient's history were reviewed and updated as appropriate: allergies, current medications, past medical history, family and social history, and problem list. Patient is a nonsmoker.    PMH reviewed.  No past medical history on file. No past surgical history on file.  Medications reviewed.    Objective:   Physical Exam BP 114/76   Pulse 94   Temp 98.6 F (37 C) (Oral)   Ht 5' 1.5" (1.562 m)   Wt 137 lb 9.6 oz (62.4 kg)   LMP 01/15/2016   BMI 25.58 kg/m  Gen:  Alert, cooperative patient who appears stated age in no acute distress.  Vital signs reviewed. HEENT: EOMI,  MMM Cardiac:  Regular rate and rhythm  Pulm:  Clear to auscultation bilaterally Abd:  Soft and nondistended. She does have some mild tenderness on deep epigastric palpation. Also minimal tenderness bilateral lower quadrants. No guarding or rebound. GYN:  External genitalia within normal limits.  Vaginal mucosa pink, moist, normal rugae.  Nonfriable cervix without lesions, no discharge or bleeding noted on speculum exam.  Pap obtained.    No results found for this or any previous visit (from the past 72 hour(s)).

## 2016-01-26 LAB — CYTOLOGY - PAP
Diagnosis: UNDETERMINED — AB
HPV: DETECTED — AB

## 2016-01-31 ENCOUNTER — Telehealth: Payer: Self-pay | Admitting: Family Medicine

## 2016-01-31 ENCOUNTER — Encounter: Payer: Self-pay | Admitting: Family Medicine

## 2016-01-31 NOTE — Progress Notes (Signed)
Please schedule Lauren Douglas for colposcopy clinic and then call to let her know (she speaks [place]) that we have done this.  The results of her pap smear have gone back abnormal and she needs further testing.  Please reassure her that a female will be doing the colposcopy -- she'll be nervous about this.  Thanks!  I have also sent her a letter with these results.  JW

## 2016-01-31 NOTE — Telephone Encounter (Signed)
Please schedule Lauren Douglas for colposcopy clinic and then call to let her know (she speaks English) that we have done this.  The results of her pap smear have gone back abnormal and she needs further testing.  Please reassure her that a female will be doing the colposcopy -- she'll be nervous about this.  Thanks!  I have also sent her a letter with these results.  JW 

## 2016-01-31 NOTE — Telephone Encounter (Signed)
Contacted pt and gave her the below information and scheduled her an appointment for 02/22/16 at 8:30am. Lamonte SakaiZimmerman Rumple, April D, CMA

## 2016-02-20 ENCOUNTER — Ambulatory Visit: Payer: 59 | Admitting: Family Medicine

## 2016-02-22 ENCOUNTER — Ambulatory Visit (INDEPENDENT_AMBULATORY_CARE_PROVIDER_SITE_OTHER): Payer: 59 | Admitting: Family Medicine

## 2016-02-22 VITALS — BP 110/82 | HR 98 | Temp 98.7°F | Ht 62.0 in | Wt 136.2 lb

## 2016-02-22 DIAGNOSIS — R87619 Unspecified abnormal cytological findings in specimens from cervix uteri: Secondary | ICD-10-CM | POA: Diagnosis not present

## 2016-02-22 DIAGNOSIS — Z3401 Encounter for supervision of normal first pregnancy, first trimester: Secondary | ICD-10-CM | POA: Diagnosis not present

## 2016-02-22 DIAGNOSIS — Z23 Encounter for immunization: Secondary | ICD-10-CM | POA: Diagnosis not present

## 2016-02-22 DIAGNOSIS — Z3A01 Less than 8 weeks gestation of pregnancy: Secondary | ICD-10-CM

## 2016-02-22 LAB — POCT URINALYSIS DIPSTICK
Bilirubin, UA: NEGATIVE
GLUCOSE UA: NEGATIVE
KETONES UA: NEGATIVE
Leukocytes, UA: NEGATIVE
Nitrite, UA: NEGATIVE
Protein, UA: NEGATIVE
RBC UA: NEGATIVE
SPEC GRAV UA: 1.015
UROBILINOGEN UA: 0.2
pH, UA: 7

## 2016-02-22 LAB — POCT URINE PREGNANCY: Preg Test, Ur: POSITIVE — AB

## 2016-02-22 MED ORDER — PRENATAL VITAMIN 27-0.8 MG PO TABS: 1.0000 | ORAL_TABLET | Freq: Every day | ORAL | 3 refills | Status: DC

## 2016-02-22 NOTE — Patient Instructions (Addendum)
DO NOT USE IBUPROFEN, ALEVE, NAPROSYN OR MOTRIN over the counter medicines. Take your prenatal vitamin which I have called in to your pharmacy. You can take tylenol or acetaminophen if you need it for pain. We have set you up for your first OB appointment and it is on the blue card.  Please call our office if you have any questions,. After hours you can call the same number for our after hours emergency line. If you need to go to the hospital emergently for some reason related to your pregnancy, go to [facility] in [place] as that is where the maternal fetal care units are. That will also be where you deliver your baby. You will get much more information during your prenatal visits with Dr. Jonathon Jordan. CONGRATULATIONS! First Trimester of Pregnancy The first trimester of pregnancy is from week 1 until the end of week 12 (months 1 through 3). A week after a sperm fertilizes an egg, the egg will implant on the wall of the uterus. This embryo will begin to develop into a baby. Genes from you and your partner are forming the baby. The female genes determine whether the baby is a boy or a girl. At 6-8 weeks, the eyes and face are formed, and the heartbeat can be seen on ultrasound. At the end of 12 weeks, all the baby's organs are formed.  Now that you are pregnant, you will want to do everything you can to have a healthy baby. Two of the most important things are to get good prenatal care and to follow your health care provider's instructions. Prenatal care is all the medical care you receive before the baby's birth. This care will help prevent, find, and treat any problems during the pregnancy and childbirth. BODY CHANGES Your body goes through many changes during pregnancy. The changes vary from woman to woman.   You may gain or lose a couple of pounds at first.  You may feel sick to your stomach (nauseous) and throw up (vomit). If the vomiting is uncontrollable, call your health care provider.  You  may tire easily.  You may develop headaches that can be relieved by medicines approved by your health care provider.  You may urinate more often. Painful urination may mean you have a bladder infection.  You may develop heartburn as a result of your pregnancy.  You may develop constipation because certain hormones are causing the muscles that push waste through your intestines to slow down.  You may develop hemorrhoids or swollen, bulging veins (varicose veins).  Your breasts may begin to grow larger and become tender. Your nipples may stick out more, and the tissue that surrounds them (areola) may become darker.  Your gums may bleed and may be sensitive to brushing and flossing.  Dark spots or blotches (chloasma, mask of pregnancy) may develop on your face. This will likely fade after the baby is born.  Your menstrual periods will stop.  You may have a loss of appetite.  You may develop cravings for certain kinds of food.  You may have changes in your emotions from day to day, such as being excited to be pregnant or being concerned that something may go wrong with the pregnancy and baby.  You may have more vivid and strange dreams.  You may have changes in your hair. These can include thickening of your hair, rapid growth, and changes in texture. Some women also have hair loss during or after pregnancy, or hair that feels dry or  thin. Your hair will most likely return to normal after your baby is born. WHAT TO EXPECT AT YOUR PRENATAL VISITS During a routine prenatal visit:  You will be weighed to make sure you and the baby are growing normally.  Your blood pressure will be taken.  Your abdomen will be measured to track your baby's growth.  The fetal heartbeat will be listened to starting around week 10 or 12 of your pregnancy.  Test results from any previous visits will be discussed. Your health care provider may ask you:  How you are feeling.  If you are feeling the  baby move.  If you have had any abnormal symptoms, such as leaking fluid, bleeding, severe headaches, or abdominal cramping.  If you are using any tobacco products, including cigarettes, chewing tobacco, and electronic cigarettes.  If you have any questions. Other tests that may be performed during your first trimester include:  Blood tests to find your blood type and to check for the presence of any previous infections. They will also be used to check for low iron levels (anemia) and Rh antibodies. Later in the pregnancy, blood tests for diabetes will be done along with other tests if problems develop.  Urine tests to check for infections, diabetes, or protein in the urine.  An ultrasound to confirm the proper growth and development of the baby.  An amniocentesis to check for possible genetic problems.  Fetal screens for spina bifida and Down syndrome.  You may need other tests to make sure you and the baby are doing well.  HIV (human immunodeficiency virus) testing. Routine prenatal testing includes screening for HIV, unless you choose not to have this test. HOME CARE INSTRUCTIONS  Medicines   Follow your health care provider's instructions regarding medicine use. Specific medicines may be either safe or unsafe to take during pregnancy.  Take your prenatal vitamins as directed.  If you develop constipation, try taking a stool softener if your health care provider approves. Diet   Eat regular, well-balanced meals. Choose a variety of foods, such as meat or vegetable-based protein, fish, milk and low-fat dairy products, vegetables, fruits, and whole grain breads and cereals. Your health care provider will help you determine the amount of weight gain that is right for you.  Avoid raw meat and uncooked cheese. These carry germs that can cause birth defects in the baby.  Eating four or five small meals rather than three large meals a day may help relieve nausea and vomiting. If you  start to feel nauseous, eating a few soda crackers can be helpful. Drinking liquids between meals instead of during meals also seems to help nausea and vomiting.  If you develop constipation, eat more high-fiber foods, such as fresh vegetables or fruit and whole grains. Drink enough fluids to keep your urine clear or pale yellow. Activity and Exercise   Exercise only as directed by your health care provider. Exercising will help you:  Control your weight.  Stay in shape.  Be prepared for labor and delivery.  Experiencing pain or cramping in the lower abdomen or low back is a good sign that you should stop exercising. Check with your health care provider before continuing normal exercises.  Try to avoid standing for long periods of time. Move your legs often if you must stand in one place for a long time.  Avoid heavy lifting.  Wear low-heeled shoes, and practice good posture.  You may continue to have sex unless your health care provider  directs you otherwise. Relief of Pain or Discomfort   Wear a good support bra for breast tenderness.   Take warm sitz baths to soothe any pain or discomfort caused by hemorrhoids. Use hemorrhoid cream if your health care provider approves.   Rest with your legs elevated if you have leg cramps or low back pain.  If you develop varicose veins in your legs, wear support hose. Elevate your feet for 15 minutes, 3-4 times a day. Limit salt in your diet. Prenatal Care   Schedule your prenatal visits by the twelfth week of pregnancy. They are usually scheduled monthly at first, then more often in the last 2 months before delivery.  Write down your questions. Take them to your prenatal visits.  Keep all your prenatal visits as directed by your health care provider. Safety   Wear your seat belt at all times when driving.  Make a list of emergency phone numbers, including numbers for family, friends, the hospital, and police and fire  departments. General Tips   Ask your health care provider for a referral to a local prenatal education class. Begin classes no later than at the beginning of month 6 of your pregnancy.  Ask for help if you have counseling or nutritional needs during pregnancy. Your health care provider can offer advice or refer you to specialists for help with various needs.  Do not use hot tubs, steam rooms, or saunas.  Do not douche or use tampons or scented sanitary pads.  Do not cross your legs for long periods of time.  Avoid cat litter boxes and soil used by cats. These carry germs that can cause birth defects in the baby and possibly loss of the fetus by miscarriage or stillbirth.  Avoid all smoking, herbs, alcohol, and medicines not prescribed by your health care provider. Chemicals in these affect the formation and growth of the baby.  Do not use any tobacco products, including cigarettes, chewing tobacco, and electronic cigarettes. If you need help quitting, ask your health care provider. You may receive counseling support and other resources to help you quit.  Schedule a dentist appointment. At home, brush your teeth with a soft toothbrush and be gentle when you floss. SEEK MEDICAL CARE IF:   You have dizziness.  You have mild pelvic cramps, pelvic pressure, or nagging pain in the abdominal area.  You have persistent nausea, vomiting, or diarrhea.  You have a bad smelling vaginal discharge.  You have pain with urination.  You notice increased swelling in your face, hands, legs, or ankles. SEEK IMMEDIATE MEDICAL CARE IF:   You have a fever.  You are leaking fluid from your vagina.  You have spotting or bleeding from your vagina.  You have severe abdominal cramping or pain.  You have rapid weight gain or loss.  You vomit blood or material that looks like coffee grounds.  You are exposed to [place] measles and have never had them.  You are exposed to fifth disease or  chickenpox.  You develop a severe headache.  You have shortness of breath.  You have any kind of trauma, such as from a fall or a car accident. This information is not intended to replace advice given to you by your health care provider. Make sure you discuss any questions you have with your health care provider. Document Released: 02/26/2001 Document Revised: 03/25/2014 Document Reviewed: 01/12/2013 Elsevier Interactive Patient Education  2017 [employer]

## 2016-02-22 NOTE — Progress Notes (Signed)
    CHIEF COMPLAINT / HPI: Normal Pap here for colposcopy Husband is present [place] well, does not require interpretor. Not having any current symptoms of pelvic pain or unusual vaginal bleeding. She did not have a menstrual cycle last month which is unusual for her. Typically she is very regular.   REVIEW OF SYSTEMS:  She's had some breast tenderness and a "unusual" sensation in her stomach. No fever. No unusual weight change. See history of present illness above for additional pertinent review of systems.  OBJECTIVE:  Vital signs are reviewed.   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 She has never been pregnant.   ASSESSMENT / PLAN: Pregnancy less than 8 weeks. By pregnancy we'll she is 5 weeks and 3 days pregnant with her due date October 21, 2016.  Discussed options. Certainly. Proceed with the colposcopy but I would avoid any type of biopsy. Given her rather mild abnormality of ASCUS with positive high-risk HPV in a 25 year old female with no prior history of pelvic issues and no prior history of cancer, nonsmoker, I certainly think we can delay colposcopy to a follow-up visit or even 2 after the pregnancy. I discussed this with her and her husband. They are quite excited about the pregnancy and after discussion they decided to delay until after the birth of their child.  I did her prenatal labs. Gave her handout on first trimester pregnancy, gave her flu shot, arranged for her to have initial OB appointment here. Spent greater than 50% of our 30 minute office visit in counseling education regarding these issues.  We gave her copies of the lab work to give to [facility] and to her employer. She is working part-time as some type of Secretary/administratorhealth aide. I told her to avoid any patients with shingles or chickenpox. Otherwise I do not see any reason for  her to stop work right now and in fact she may be able to work for the majority of her pregnancy and she is otherwise healthy. We discussed over-the-counter medications she will discontinue ibuprofen which she has not taken a long time anyway. See patient instructions for further details.

## 2016-02-23 LAB — OBSTETRIC PANEL
Antibody Screen: NEGATIVE
Basophils Absolute: 42 cells/uL (ref 0–200)
Basophils Relative: 1 %
Eosinophils Absolute: 126 cells/uL (ref 15–500)
Eosinophils Relative: 3 %
HCT: 41.3 % (ref 35.0–45.0)
HEP B S AG: NEGATIVE
Hemoglobin: 13.6 g/dL (ref 11.7–15.5)
LYMPHS ABS: 1638 {cells}/uL (ref 850–3900)
Lymphocytes Relative: 39 %
MCH: 29.2 pg (ref 27.0–33.0)
MCHC: 32.9 g/dL (ref 32.0–36.0)
MCV: 88.6 fL (ref 80.0–100.0)
MONO ABS: 378 {cells}/uL (ref 200–950)
MPV: 9.1 fL (ref 7.5–12.5)
Monocytes Relative: 9 %
Neutro Abs: 2016 cells/uL (ref 1500–7800)
Neutrophils Relative %: 48 %
PLATELETS: 261 10*3/uL (ref 140–400)
RBC: 4.66 MIL/uL (ref 3.80–5.10)
RDW: 13 % (ref 11.0–15.0)
RH TYPE: POSITIVE
RUBELLA: 16.6 {index} — AB (ref <0.90)
WBC: 4.2 10*3/uL (ref 3.8–10.8)

## 2016-02-23 LAB — SICKLE CELL SCREEN: Sickle Cell Screen: POSITIVE — AB

## 2016-02-23 LAB — CULTURE, OB URINE

## 2016-02-23 LAB — HIV ANTIBODY (ROUTINE TESTING W REFLEX): HIV: NONREACTIVE

## 2016-03-04 NOTE — Progress Notes (Signed)
Lauren BartersSalmah Douglas is a 25 y.o. yo G1P0 at  1649w1d based on LMP who presents for her initial prenatal visit. Pregnancy  is notplanned, but welcomed  She reports breast tenderness, fatigue, morning sickness and positive pregnancy test in her last vist here. She  isTaking PNV. See flow sheet for details.  PMH, POBH, FH, meds, allergies and Social Hx reviewed.  Prenatal exam:Gen: Well nourished, well developed.  No distress.  Vitals noted. HEENT: Normocephalic, atraumatic.  Neck supple without cervical lymphadenopathy, thyromegaly or thyroid nodules.  fair dentition. CV: RRR no murmur, gallops or rubs Lungs: CTA B.  Normal respiratory effort without wheezes or rales. Abd: soft, NTND. +BS.  Uterus not appreciated above pelvis. GU: Normal external female genitalia without lesions.  Nl vaginal, well rugated without lesions. No vaginal discharge.  Bimanual exam: No adnexal mass or TTP. No CMT.   Ext: No clubbing, cyanosis or edema. Psych: Normal grooming and dress. Not depressed or anxious appearing.  Normal thought content and process without flight of ideas or looseness of associations   Assessment/plan: 1) Pregnancy  doing well.  Current pregnancy issues include None Dating is reliable Prenatal labs reviewed, notable for positive sickle cell screen. Will order further testing for sickle cell anemia.  Abnormal pap: Patient has ASCUS with high risk HPV on 01/23/16 pap smear. Discussed risks and benefits of receiving colposcopy procedure while pregnant. Patient unsure what she would like to do at this time but she will think about it. If she chooses to wait until after the pregnancy to have a colpo procedure, it should be 6 weeks post partum.  Bleeding and pain precautions reviewed. Importance of prenatal vitamins reviewed.  Genetic screening offered, patient does not know if she wants this or not. She will think about it.  Early glucola is not indicated.    Follow up 4 weeks.

## 2016-03-05 ENCOUNTER — Other Ambulatory Visit (HOSPITAL_COMMUNITY)
Admission: RE | Admit: 2016-03-05 | Discharge: 2016-03-05 | Disposition: A | Discharge status: Home / Self Care | Payer: 59 | Source: Ambulatory Visit | Attending: Family Medicine | Admitting: Family Medicine

## 2016-03-05 ENCOUNTER — Encounter: Payer: Self-pay | Admitting: Family Medicine

## 2016-03-05 ENCOUNTER — Ambulatory Visit (INDEPENDENT_AMBULATORY_CARE_PROVIDER_SITE_OTHER): Payer: 59 | Admitting: Family Medicine

## 2016-03-05 VITALS — BP 114/64 | HR 81 | Temp 98.2°F | Wt 136.0 lb

## 2016-03-05 DIAGNOSIS — Z113 Encounter for screening for infections with a predominantly sexual mode of transmission: Secondary | ICD-10-CM | POA: Diagnosis present

## 2016-03-05 DIAGNOSIS — Z3401 Encounter for supervision of normal first pregnancy, first trimester: Secondary | ICD-10-CM

## 2016-03-05 DIAGNOSIS — Z130 Encounter for screening for diseases of the blood and blood-forming organs and certain disorders involving the immune mechanism: Secondary | ICD-10-CM

## 2016-03-05 NOTE — Patient Instructions (Signed)
Follow up in 4 weeks   First Trimester of Pregnancy The first trimester of pregnancy is from week 1 until the end of week 12 (months 1 through 3). During this time, your baby will begin to develop inside you. At 6-8 weeks, the eyes and face are formed, and the heartbeat can be seen on ultrasound. At the end of 12 weeks, all the baby's organs are formed. Prenatal care is all the medical care you receive before the birth of your baby. Make sure you get good prenatal care and follow all of your doctor's instructions. Follow these instructions at home: Medicines  Take medicine only as told by your doctor. Some medicines are safe and some are not during pregnancy.  Take your prenatal vitamins as told by your doctor.  Take medicine that helps you poop (stool softener) as needed if your doctor says it is okay. Diet  Eat regular, healthy meals.  Your doctor will tell you the amount of weight gain that is right for you.  Avoid raw meat and uncooked cheese.  If you feel sick to your stomach (nauseous) or throw up (vomit):  Eat 4 or 5 small meals a day instead of 3 large meals.  Try eating a few soda crackers.  Drink liquids between meals instead of during meals.  If you have a hard time pooping (constipation):  Eat high-fiber foods like fresh vegetables, fruit, and whole grains.  Drink enough fluids to keep your pee (urine) clear or pale yellow. Activity and Exercise  Exercise only as told by your doctor. Stop exercising if you have cramps or pain in your lower belly (abdomen) or low back.  Try to avoid standing for long periods of time. Move your legs often if you must stand in one place for a long time.  Avoid heavy lifting.  Wear low-heeled shoes. Sit and stand up straight.  You can have sex unless your doctor tells you not to. Relief of Pain or Discomfort  Wear a good support bra if your breasts are sore.  Take warm water baths (sitz baths) to soothe pain or discomfort  caused by hemorrhoids. Use hemorrhoid cream if your doctor says it is okay.  Rest with your legs raised if you have leg cramps or low back pain.  Wear support hose if you have puffy, bulging veins (varicose veins) in your legs. Raise (elevate) your feet for 15 minutes, 3-4 times a day. Limit salt in your diet. Prenatal Care  Schedule your prenatal visits by the twelfth week of pregnancy.  Write down your questions. Take them to your prenatal visits.  Keep all your prenatal visits as told by your doctor. Safety  Wear your seat belt at all times when driving.  Make a list of emergency phone numbers. The list should include numbers for family, friends, the hospital, and police and fire departments. General Tips  Ask your doctor for a referral to a local prenatal class. Begin classes no later than at the start of month 6 of your pregnancy.  Ask for help if you need counseling or help with nutrition. Your doctor can give you advice or tell you where to go for help.  Do not use hot tubs, steam rooms, or saunas.  Do not douche or use tampons or scented sanitary pads.  Do not cross your legs for long periods of time.  Avoid litter boxes and soil used by cats.  Avoid all smoking, herbs, and alcohol. Avoid drugs not approved by your doctor.  Do not use any tobacco products, including cigarettes, chewing tobacco, and electronic cigarettes. If you need help quitting, ask your doctor. You may get counseling or other support to help you quit.  Visit your dentist. At home, brush your teeth with a soft toothbrush. Be gentle when you floss. Get help if:  You are dizzy.  You have mild cramps or pressure in your lower belly.  You have a nagging pain in your belly area.  You continue to feel sick to your stomach, throw up, or have watery poop (diarrhea).  You have a bad smelling fluid coming from your vagina.  You have pain with peeing (urination).  You have increased puffiness  (swelling) in your face, hands, legs, or ankles. Get help right away if:  You have a fever.  You are leaking fluid from your vagina.  You have spotting or bleeding from your vagina.  You have very bad belly cramping or pain.  You gain or lose weight rapidly.  You throw up blood. It may look like coffee grounds.  You are around people who have [place] measles, fifth disease, or chickenpox.  You have a very bad headache.  You have shortness of breath.  You have any kind of trauma, such as from a fall or a car accident. This information is not intended to replace advice given to you by your health care provider. Make sure you discuss any questions you have with your health care provider. Document Released: 08/21/2007 Document Revised: 08/10/2015 Document Reviewed: 01/12/2013 Elsevier Interactive Patient Education  2017 [employer]

## 2016-03-06 LAB — URINE CYTOLOGY ANCILLARY ONLY
CHLAMYDIA, DNA PROBE: NEGATIVE
Neisseria Gonorrhea: NEGATIVE

## 2016-03-07 LAB — HEMOGLOBINOPATHY EVALUATION
HEMATOCRIT: 37.9 % (ref 35.0–45.0)
HEMOGLOBIN: 12.6 g/dL (ref 11.7–15.5)
HGB S QUANTITAION: 39.7 % — AB
Hgb A2 Quant: 4.4 % — ABNORMAL HIGH (ref 1.8–3.5)
Hgb A: 54.9 % — ABNORMAL LOW (ref >96.0)
Hgb F Quant: <1 % (ref <2.0)
MCH: 29.6 pg (ref 27.0–33.0)
MCV: 89 fL (ref 80.0–100.0)
RBC: 4.26 MIL/uL (ref 3.80–5.10)
RDW: 13.1 % (ref 11.0–15.0)

## 2016-03-11 ENCOUNTER — Encounter: Payer: Self-pay | Admitting: Family Medicine

## 2016-03-11 DIAGNOSIS — Z3400 Encounter for supervision of normal first pregnancy, unspecified trimester: Secondary | ICD-10-CM | POA: Insufficient documentation

## 2016-03-13 ENCOUNTER — Encounter: Payer: Self-pay | Admitting: Family Medicine

## 2016-03-13 DIAGNOSIS — D573 Sickle-cell trait: Secondary | ICD-10-CM | POA: Insufficient documentation

## 2016-03-18 NOTE — L&D Delivery Note (Signed)
Patient is a 26 y.o. now G1P0000 who admitted for Pre-eclampsia. She was in early labor, and progressed on her own, now s/p NSVD at 3471w2d  Delivery Note At 6:00 AM a viable female was delivered via Vaginal, Spontaneous Delivery Presentation: OA APGAR: 8, 9 Weight: pending   Placenta status: intact; to L&D  Cord: 3-vessel  Anesthesia:  none Episiotomy: None Lacerations: 1st degree Perineal; bilateral periutrethral Suture Repair: none Est. Blood Loss (mL): 50  Mother complete and pushing. Head delivered OA, with reconstitution to maternal right. Nuchal cord x1 present, which was reduced, then shoulder and body delivered in usual fashion. Moderate meconium noted. Infant to mother's abdomen; spontaneous cry with tactile stimulation. Cord clamped x 2 after 1-minute delay, and cut by family member. Cord blood drawn. Placenta delivered spontaneously with gentle cord traction. Fundus firm with massage and Pitocin. Perineum inspected and found to have small first degree laceration and bilateral periurethral which were found to be hemostatic.  Mom to postpartum.  Baby to Couplet care / Skin to Skin.  Raynelle FanningJulie P. Jasmin Trumbull, MD OB Fellow 10/23/16, 6:18 AM

## 2016-03-27 ENCOUNTER — Telehealth: Payer: Self-pay | Admitting: Family Medicine

## 2016-03-27 ENCOUNTER — Encounter: Payer: Self-pay | Admitting: Family Medicine

## 2016-03-27 NOTE — Telephone Encounter (Signed)
Called patient and discussed positive sickle cell trait lab. Explained to patient what this means and the risk of her baby having sickle cell. Advised that her husband be tested for sickle cell. She states they are going to the doctor next week and will ask then. Offered genetic counseling for the patient, she would like to pursue that at this time. Called preceptor office, Dr. Lum BabeEniola answered and was able to give me the number for Maternal fetal medicine. We were able to schedule patient an appointment for January 18th at 10am. She was given the number to call in case she needs to rechedule [phone]). She was appreciative of the call.   Anders Simmondshristina Gambino, MD [facility], PGY-2

## 2016-03-27 NOTE — Telephone Encounter (Signed)
She'll might need to make an appointment for this. Can you call and ask what it's for?  I'll decide if she needs to come in after that or can just put in the referral.

## 2016-03-27 NOTE — Telephone Encounter (Signed)
Needs refill for genetic counsleing. Please send referral so pt can schedule her appt

## 2016-03-28 NOTE — Telephone Encounter (Signed)
Thank you for handling this.  It looks like she's scheduled for 1/18.  Will close encounter.

## 2016-03-28 NOTE — Telephone Encounter (Signed)
Pt wanted to know if she could have her husband tested for sickle cell here. Pt also wanted Dr. Jonathon JordanGambino to call her regarding previous phone call. ep

## 2016-03-28 NOTE — Telephone Encounter (Signed)
Called patient back. No answer. Left voicemail. If she calls back you can tell her that her husband can be tested here if he is a patient at our clinic, otherwise I would advise he go to his PCP's office.   Anders Simmondshristina Ataya Murdy, MD [facility], PGY-2

## 2016-03-28 NOTE — Telephone Encounter (Signed)
Please see Dr. Pennie RushingGambino's note from yesterday.

## 2016-03-29 ENCOUNTER — Telehealth: Payer: Self-pay | Admitting: Family Medicine

## 2016-03-29 DIAGNOSIS — D573 Sickle-cell trait: Secondary | ICD-10-CM

## 2016-03-29 NOTE — Telephone Encounter (Signed)
Referral to MFM placed

## 2016-04-04 ENCOUNTER — Ambulatory Visit (HOSPITAL_COMMUNITY)
Admission: RE | Admit: 2016-04-04 | Discharge: 2016-04-04 | Disposition: A | Discharge status: Home / Self Care | Payer: 59 | Source: Ambulatory Visit | Attending: Family Medicine | Admitting: Family Medicine

## 2016-04-04 DIAGNOSIS — D573 Sickle-cell trait: Secondary | ICD-10-CM

## 2016-04-04 DIAGNOSIS — Z3A11 11 weeks gestation of pregnancy: Secondary | ICD-10-CM | POA: Insufficient documentation

## 2016-04-04 NOTE — Progress Notes (Signed)
Genetic Counseling  High-Risk Gestation Note  Appointment Date:  04/04/2016 Referred By: Carlyle Dolly, MD Date of Birth:  [birth year] Partner:  Lauren Douglas   Pregnancy History: G1P0000 Estimated Date of Delivery: 10/21/16 Estimated Gestational Age: 64w3dAttending: MRenella Cunas MD   I met with [name] and her partner, Mr. Lauren Douglas for genetic counseling because routine screening through her OB office identified Lauren Douglas to have sickle cell trait.  In summary:  Reviewed hemoglobin electrophoresis results with patient   Hb AS detected (sickle cell trait)  Reviewed autosomal recessive inheritance  Father of the pregnancy has not yet had screening for hemoglobin variants  Given reported negative family history and [place]estry for Lauren Douglas, general population chance to carry hemoglobin variant may be up to approximately 5%  Risk for hemoglobinopathy in pregnancy would be approximately 1 in 100 (sickle beta thal, sickle cell disease, etc) using general population carrier frequency for Lauren Douglas  Discussed options of screening / testing  Hemoglobin electrophoresis for father of the pregnancy- Mr. Lauren Fairelected to proceed with this screening today  Reviewed that these results will further refine risk for pregnancy  Prenatal diagnosis via amniocentesis would be available in case where both parents were identified carriers with identified pathogenic variants- Lauren Douglas indicated she would not likely be interested in amniocentesis  Newborn screening - reviewed that hemoglobinopathies [place] Discussed general population carrier screening options  CF- declined  SMA- declined  Lauren Douglas was offered testing for sickle cell anemia (SCA) because this condition occurs more commonly among individuals with African ancestry. 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 We reviewed that this screening was performed via hemoglobin electrophoresis and identified the presence of hemoglobin AS (Hb AS).   We discussed that SCA affects the shape and function of the red blood cell by producing abnormal hemoglobin. They were counseled that hemoglobin is a protein in the RBCs that carries oxygen to the body's organs. Individuals who have SCA have a changes within the genes that codes for hemoglobin. We spent time reviewing genes, chromosomes, and patterns of inheritance. This couple was counseled that SCA is inherited in an autosomal recessive manner, and occurs when both copies of the hemoglobin gene are changed and produce an abnormal hemoglobin S. Typically, one abnormal gene for the production of hemoglobin S is inherited from each parent. A carrier of SCA has one altered copy of the gene for hemoglobin and one typical working copy. Carriers of recessive conditions typically do not have symptoms related to the condition because they still have one functioning copy of the gene, and thus some production of the typical protein coded for by that gene. We reviewed that when both parents are carriers of an autosomal recessive condition, each pregnancy has a 1 in 4 (25%) chance to inherit the condition, a 1 in 2 (50%) chance to be a carrier, and a 1 in 4 (25%) chance to neither affected nor a carrier. When one parent is a carrier but the other is not, the pregnancy is not at increased risk for the condition but has a 1 in 2 (50%) chance to also be a carrier.   Given the recessive inheritance, we discussed the importance of understanding Lauren Douglas's carrier status in order to accurately predict the risk of a hemoglobinopathy in the fetus. He has not  previously had screening for hemoglobin variants. Lauren Douglas reported that he is originally from [place] and has no known family history of hemoglobin variants. Consanguinity  was denied for the couple.  We discussed that other hemoglobin variants, such as beta thalassemia or hemoglobin C when combined with hemoglobin S, can cause a medically significant hemoglobinopathy. The carrier frequency of beta thalassemia trait for individuals with [place] ancestry is estimated to be 5% Lauren Douglas et al, 2003). Thus, prior to carrier screening for the father of the pregnancy, the risk for a hemoglobinopathy (sickle-beta thalassemia) would be approximately 1 in 100 (1%).   Lauren Douglas was offered the option of testing (hemoglobin electrophoresis) to determine whether he has any hemoglobin variant, including sickle cell trait. He elected to pursue this screening today through our office. We reviewed that in the event that both parents are identified to be carriers of hemoglobin variants, and if the pathogenic variant is identified, prenatal diagnosis via amniocentesis would be available. We reviewed the risks, benefits, and limitations of amniocentesis, including the associated 1 in [phone] risk for complications, including spontaneous pregnancy loss. Lauren Douglas indicated that she would not likely consider amniocentesis given the associated risk for complications. Lauren Douglas understands that prenatal ultrasound is not expected to assist in screening for hemoglobinopathies. We also reviewed the availability of newborn screening in [place] for hemoglobinopathies.   The family histories were otherwise found to be noncontributory for birth defects, intellectual disability, and known genetic conditions. Without further information regarding the provided family history, an accurate genetic risk cannot be calculated. Further genetic counseling is warranted if more information is obtained.  [name] was provided with written information regarding cystic fibrosis (CF) and spinal muscular atrophy (SMA) including the carrier frequency, availability of carrier screening and  prenatal diagnosis if indicated.  In addition, we discussed that CF and hemoglobinopathies are routinely screened for as part of the [place] newborn screening panel.  After further discussion, she declined screening for CF and SMA.  Ms. Danyia Borunda denied exposure to environmental toxins or chemical agents. She denied the use of alcohol, tobacco or street drugs. She denied significant viral illnesses during the course of her pregnancy. Her medical and surgical histories were noncontributory.   I counseled this couple regarding the above risks and available options.  The approximate face-to-face time with the genetic counselor was 45 minutes.  Chipper Oman, MS Certified Genetic Counselor 04/04/2016

## 2016-04-10 ENCOUNTER — Encounter: Payer: 59 | Admitting: Internal Medicine

## 2016-04-10 ENCOUNTER — Ambulatory Visit (INDEPENDENT_AMBULATORY_CARE_PROVIDER_SITE_OTHER): Payer: 59 | Admitting: Internal Medicine

## 2016-04-10 VITALS — BP 115/80 | HR 80 | Temp 98.4°F | Wt 134.8 lb

## 2016-04-10 DIAGNOSIS — Z3A12 12 weeks gestation of pregnancy: Secondary | ICD-10-CM

## 2016-04-10 NOTE — Patient Instructions (Addendum)
Please make a lab appointment for your blood sugar test to screen for diabetes.   Please make a prenatal visit in 4 weeks.   Please make sure you get this ultrasound done. We will call you with the results.   Take Care,  Dr. Earlene PlaterWallace

## 2016-04-10 NOTE — Progress Notes (Signed)
Lauren Douglas is a 26 y.o. G1P0000 at 6252w2d here for routine follow up.  She reports no complaints. Is taking gummy PNV daily. See flow sheet for details.  A/P: Pregnancy at 4152w2d.  Doing well.   Pregnancy issues include +sickle cell trait. Has been referred to genetic counseling and MFM.  Patient has Pap result from Nov 2017 with ASCUS with high risk HPV. Patient still deciding whether to proceed with colposcopy during pregnancy.  Patient is interested in genetic screening. Will plan for AFP Quad screen at next visit.  Discussed with Dr. Pollie MeyerMcIntyre and would recommend early Glucola given african ethnicity. Will place future order and patient will return for lab appointment.  Unable to hear heart tones on doppler. Baby with movement visualized on bedside sono and heartbeat visualized as well. Will place order for US to further assess heartbeat.  Bleeding and pain precautions reviewed. Follow up 4 weeks.

## 2016-04-11 ENCOUNTER — Telehealth (HOSPITAL_COMMUNITY): Payer: Self-pay | Admitting: MS"

## 2016-04-11 NOTE — Telephone Encounter (Signed)
Talked with Katyana Brucker regarding results of hemoglobin electrophoresis for the father of the pregnancy, Mr. Sallye LatMazen Rostomo (MRN: [id number]). Patient identified him by name and DOB. Reviewed that hemoglobin electrophoresis is within normal range for him, indicating the presence of normal adult hemoglobin. Thus, the pregnancy is not expected to be at increased risk for sickle cell disease but rather a chance for sickle cell trait. Patient expressed understanding and had no further questions at this time.   Clydie BraunKaren Ailea Rhatigan 04/11/2016 10:26 AM

## 2016-04-11 NOTE — Telephone Encounter (Signed)
Attempted to call patient regarding results of carrier screening (hemoglobin electrophoresis) for patient's partner, Mazen. Left message for patient to return call.   Clydie BraunKaren Odena Mcquaid 04/11/2016 8:41 AM

## 2016-04-17 ENCOUNTER — Ambulatory Visit (HOSPITAL_COMMUNITY)
Admission: RE | Admit: 2016-04-17 | Discharge: 2016-04-17 | Disposition: A | Discharge status: Home / Self Care | Payer: 59 | Source: Ambulatory Visit | Attending: Family Medicine | Admitting: Family Medicine

## 2016-04-17 DIAGNOSIS — Z3A12 12 weeks gestation of pregnancy: Secondary | ICD-10-CM

## 2016-04-17 DIAGNOSIS — Z3A13 13 weeks gestation of pregnancy: Secondary | ICD-10-CM | POA: Insufficient documentation

## 2016-04-19 ENCOUNTER — Ambulatory Visit (HOSPITAL_COMMUNITY): Payer: 59

## 2016-04-23 ENCOUNTER — Other Ambulatory Visit (INDEPENDENT_AMBULATORY_CARE_PROVIDER_SITE_OTHER): Payer: 59

## 2016-04-23 DIAGNOSIS — Z3A12 12 weeks gestation of pregnancy: Secondary | ICD-10-CM

## 2016-04-23 LAB — POCT 1 HR PRENATAL GLUCOSE: GLUCOSE 1 HR PRENATAL, POC: 164 mg/dL

## 2016-05-08 ENCOUNTER — Ambulatory Visit (INDEPENDENT_AMBULATORY_CARE_PROVIDER_SITE_OTHER): Payer: Self-pay | Admitting: Internal Medicine

## 2016-05-08 VITALS — BP 102/58 | HR 81 | Temp 98.5°F | Wt 141.0 lb

## 2016-05-08 DIAGNOSIS — Z3402 Encounter for supervision of normal first pregnancy, second trimester: Secondary | ICD-10-CM

## 2016-05-08 NOTE — Patient Instructions (Signed)
Please make an appointment with Dr. Jonathon [facility]Gambino [facility] for your [facility] visit in 4 weeks.

## 2016-05-08 NOTE — Progress Notes (Signed)
Lauren Douglas is a 26 y.o. G1P0000 at 4355w2d here for routine follow up.  She reports no complaints. See flow sheet for details.  A/P: Pregnancy at 1755w2d.  Doing well.   Pregnancy issues include sickle cell trait. Has been referred to genetic counseling and MFM.  Patient has Pap result from Nov 2017 with ASCUS with high risk HPV. Patient still deciding whether to proceed with colposcopy during pregnancy.  Early 1hr GTT elevated at 164. 3h GTT has been ordered and encouraged patient to schedule lab appointment ASAP.  Anatomy ultrasound ordered to be scheduled at 18-19 weeks. Patient is interested in genetic screening. AFP quad screen ordered.  Bleeding and pain precautions reviewed. Follow up 4 weeks.

## 2016-05-16 ENCOUNTER — Other Ambulatory Visit: Payer: 59

## 2016-05-16 ENCOUNTER — Other Ambulatory Visit: Payer: Self-pay | Admitting: Family Medicine

## 2016-05-17 LAB — GESTATIONAL GLUCOSE TOLERANCE
GLUCOSE 1 HOUR GTT: 91 mg/dL (ref 65–179)
GLUCOSE 2 HOUR GTT: 79 mg/dL (ref 65–154)
Glucose, Fasting: 73 mg/dL (ref 65–94)
Glucose, GTT - 3 Hour: 66 mg/dL (ref 65–139)

## 2016-06-06 ENCOUNTER — Other Ambulatory Visit: Payer: Self-pay | Admitting: Internal Medicine

## 2016-06-06 ENCOUNTER — Ambulatory Visit (HOSPITAL_COMMUNITY)
Admission: RE | Admit: 2016-06-06 | Discharge: 2016-06-06 | Disposition: A | Discharge status: Home / Self Care | Payer: 59 | Source: Ambulatory Visit | Attending: Family Medicine | Admitting: Family Medicine

## 2016-06-06 DIAGNOSIS — Z3A20 20 weeks gestation of pregnancy: Secondary | ICD-10-CM | POA: Insufficient documentation

## 2016-06-06 DIAGNOSIS — Z3402 Encounter for supervision of normal first pregnancy, second trimester: Secondary | ICD-10-CM

## 2016-06-06 DIAGNOSIS — Z363 Encounter for antenatal screening for malformations: Secondary | ICD-10-CM

## 2016-06-11 ENCOUNTER — Ambulatory Visit (INDEPENDENT_AMBULATORY_CARE_PROVIDER_SITE_OTHER): Payer: 59 | Admitting: Family Medicine

## 2016-06-11 DIAGNOSIS — Z3402 Encounter for supervision of normal first pregnancy, second trimester: Secondary | ICD-10-CM

## 2016-06-11 NOTE — Patient Instructions (Signed)
Second Trimester of Pregnancy The second trimester is from week 13 through week 28, month 4 through 6. This is often the time in pregnancy that you feel your best. Often times, morning sickness has lessened or quit. You may have more energy, and you may get hungry more often. Your unborn baby (fetus) is growing rapidly. At the end of the sixth month, he or she is about 9 inches long and weighs about 1 pounds. You will likely feel the baby move (quickening) between 18 and 20 weeks of pregnancy. Follow these instructions at home:  Avoid all smoking, herbs, and alcohol. Avoid drugs not approved by your doctor.  Do not use any tobacco products, including cigarettes, chewing tobacco, and electronic cigarettes. If you need help quitting, ask your doctor. You may get counseling or other support to help you quit.  Only take medicine as told by your doctor. Some medicines are safe and some are not during pregnancy.  Exercise only as told by your doctor. Stop exercising if you start having cramps.  Eat regular, healthy meals.  Wear a good support bra if your breasts are tender.  Do not use hot tubs, steam rooms, or saunas.  Wear your seat belt when driving.  Avoid raw meat, uncooked cheese, and liter boxes and soil used by cats.  Take your prenatal vitamins.  Take 1500-2000 milligrams of calcium daily starting at the 20th week of pregnancy until you deliver your baby.  Try taking medicine that helps you poop (stool softener) as needed, and if your doctor approves. Eat more fiber by eating fresh fruit, vegetables, and whole grains. Drink enough fluids to keep your pee (urine) clear or pale yellow.  Take warm water baths (sitz baths) to soothe pain or discomfort caused by hemorrhoids. Use hemorrhoid cream if your doctor approves.  If you have puffy, bulging veins (varicose veins), wear support hose. Raise (elevate) your feet for 15 minutes, 3-4 times a day. Limit salt in your diet.  Avoid heavy  lifting, wear low heals, and sit up straight.  Rest with your legs raised if you have leg cramps or low back pain.  Visit your dentist if you have not gone during your pregnancy. Use a soft toothbrush to brush your teeth. Be gentle when you floss.  You can have sex (intercourse) unless your doctor tells you not to.  Go to your doctor visits. Get help if:  You feel dizzy.  You have mild cramps or pressure in your lower belly (abdomen).  You have a nagging pain in your belly area.  You continue to feel sick to your stomach (nauseous), throw up (vomit), or have watery poop (diarrhea).  You have bad smelling fluid coming from your vagina.  You have pain with peeing (urination). Get help right away if:  You have a fever.  You are leaking fluid from your vagina.  You have spotting or bleeding from your vagina.  You have severe belly cramping or pain.  You lose or gain weight rapidly.  You have trouble catching your breath and have chest pain.  You notice sudden or extreme puffiness (swelling) of your face, hands, ankles, feet, or legs.  You have not felt the baby move in over an hour.  You have severe headaches that do not go away with medicine.  You have vision changes. This information is not intended to replace advice given to you by your health care provider. Make sure you discuss any questions you have with your health care   provider. Document Released: 05/29/2009 Document Revised: 08/10/2015 Document Reviewed: 05/05/2012 Elsevier Interactive Patient Education  2017 Elsevier Inc.  

## 2016-06-11 NOTE — Progress Notes (Signed)
    Routine OB: 19-24 WEEKS  Lauren Douglas is a 26 y.o. G1P0000 at 609w1d here for routine follow up.  She reports excitement about finding out baby was a girl. Denies vaginal symptoms or LOF or contractions.  See flow sheet for details.  A/P: Pregnancy at 929w1d.  Doing well.   Pregnancy issues include patient unsure about well baby care (referred to [facility]), patient unsure if she is feeling baby move - counseled on anterior placenta and flutter like feeling - patient thinks she is feeling this.  Anatomy scan reviewed, problems are not noted.  Preterm labor precautions reviewed. Follow up 4 weeks.   Loni MuseKate Olena Willy, MD, PGY1 06/11/2016 4:40 PM

## 2016-07-11 ENCOUNTER — Ambulatory Visit (INDEPENDENT_AMBULATORY_CARE_PROVIDER_SITE_OTHER): Payer: 59 | Admitting: Family Medicine

## 2016-07-11 VITALS — BP 104/60 | HR 73 | Temp 98.9°F | Wt 153.0 lb

## 2016-07-11 DIAGNOSIS — Z23 Encounter for immunization: Secondary | ICD-10-CM

## 2016-07-11 DIAGNOSIS — Z3402 Encounter for supervision of normal first pregnancy, second trimester: Secondary | ICD-10-CM

## 2016-07-11 LAB — POCT 1 HR PRENATAL GLUCOSE: GLUCOSE 1 HR PRENATAL, POC: 143 mg/dL

## 2016-07-11 NOTE — Patient Instructions (Signed)

## 2016-07-11 NOTE — Progress Notes (Signed)
Lauren Douglas is a 26 y.o. G1P0000 at [redacted]w[redacted]d for routine follow up.  She reports that she has been doing well. Denies any contractions, vaginal discharge, vaginal bleeding, edema.  See flow sheet for details.  A/P: Pregnancy at [redacted]w[redacted]d.  Doing well.   Pregnancy issues include: Sickle cell trait. Her and her husband have already been seen by genetic counselor.  Childbirth and education classes were offered. Patient given information for classes at [facility]. 1 hr GTT mildly elevated today at 143 (had previously normal 3 hr GTT last month). Will recheck 3 hr GTT on 4/30. TDap vaccine given today Preterm labor precautions reviewed. Follow up 3-4 weeks for next prenatal visit.  Anders Simmonds, MD [facility], PGY-2

## 2016-07-12 NOTE — Addendum Note (Signed)
Addended by: Jennette Bill on: 07/12/2016 08:25 AM   Modules accepted: Orders

## 2016-07-12 NOTE — Addendum Note (Signed)
Addended by: Jennette Bill on: 07/12/2016 08:26 AM   Modules accepted: Orders

## 2016-07-15 ENCOUNTER — Ambulatory Visit: Payer: 59

## 2016-07-15 ENCOUNTER — Other Ambulatory Visit (INDEPENDENT_AMBULATORY_CARE_PROVIDER_SITE_OTHER): Payer: 59

## 2016-07-15 DIAGNOSIS — Z3402 Encounter for supervision of normal first pregnancy, second trimester: Secondary | ICD-10-CM

## 2016-07-15 LAB — POCT CBG (FASTING - GLUCOSE)-MANUAL ENTRY: Glucose Fasting, POC: 91 mg/dL (ref 70–99)

## 2016-07-16 LAB — GESTATIONAL GLUCOSE TOLERANCE
GLUCOSE 1 HOUR GTT: 103 mg/dL (ref 65–179)
GLUCOSE FASTING: 75 mg/dL (ref 65–94)
Glucose, GTT - 2 Hour: 79 mg/dL (ref 65–154)
Glucose, GTT - 3 Hour: 88 mg/dL (ref 65–139)

## 2016-07-25 ENCOUNTER — Ambulatory Visit (INDEPENDENT_AMBULATORY_CARE_PROVIDER_SITE_OTHER): Payer: 59 | Admitting: Family Medicine

## 2016-07-25 VITALS — BP 99/58 | HR 98 | Temp 97.9°F | Wt 153.0 lb

## 2016-07-25 DIAGNOSIS — Z3402 Encounter for supervision of normal first pregnancy, second trimester: Secondary | ICD-10-CM

## 2016-07-25 DIAGNOSIS — O2342 Unspecified infection of urinary tract in pregnancy, second trimester: Secondary | ICD-10-CM

## 2016-07-25 LAB — POCT UA - MICROSCOPIC ONLY
Epithelial cells, urine per micros: >20
WBC, Ur, HPF, POC: >20

## 2016-07-25 LAB — POCT URINALYSIS DIP (MANUAL ENTRY)
Bilirubin, UA: NEGATIVE
Glucose, UA: NEGATIVE mg/dL
Ketones, POC UA: NEGATIVE mg/dL
Nitrite, UA: NEGATIVE
PH UA: 6 (ref 5.0–8.0)
PROTEIN UA: NEGATIVE mg/dL
RBC UA: NEGATIVE
Spec Grav, UA: 1.015 (ref 1.010–1.025)
Urobilinogen, UA: 0.2 E.U./dL

## 2016-07-25 MED ORDER — CEPHALEXIN 500 MG PO CAPS: 500.0000 mg | ORAL_CAPSULE | Freq: Two times a day (BID) | ORAL | 0 refills | Status: DC

## 2016-07-25 NOTE — Patient Instructions (Signed)
Follow up in 2 weeks in [facility] clinic w/ Dr Lum Babe or Dr Pollie Meyer.  You will have blood labs at that appt.  Second Trimester of Pregnancy The second trimester is from week 14 through week 27 (months 4 through 6). The second trimester is often a time when you feel your best. Your body has adjusted to being pregnant, and you begin to feel better physically. Usually, morning sickness has lessened or quit completely, you may have more energy, and you may have an increase in appetite. The second trimester is also a time when the fetus is growing rapidly. At the end of the sixth month, the fetus is about 9 inches long and weighs about 1 pounds. You will likely begin to feel the baby move (quickening) between 16 and 20 weeks of pregnancy. Body changes during your second trimester Your body continues to go through many changes during your second trimester. The changes vary from woman to woman.  Your weight will continue to increase. You will notice your lower abdomen bulging out.  You may begin to get stretch marks on your hips, abdomen, and breasts.  You may develop headaches that can be relieved by medicines. The medicines should be approved by your health care provider.  You may urinate more often because the fetus is pressing on your bladder.  You may develop or continue to have heartburn as a result of your pregnancy.  You may develop constipation because certain hormones are causing the muscles that push waste through your intestines to slow down.  You may develop hemorrhoids or swollen, bulging veins (varicose veins).  You may have back pain. This is caused by:  Weight gain.  Pregnancy hormones that are relaxing the joints in your pelvis.  A shift in weight and the muscles that support your balance.  Your breasts will continue to grow and they will continue to become tender.  Your gums may bleed and may be sensitive to brushing and flossing.  Dark spots or blotches (chloasma, mask of  pregnancy) may develop on your face. This will likely fade after the baby is born.  A dark line from your belly button to the pubic area (linea nigra) may appear. This will likely fade after the baby is born.  You may have changes in your hair. These can include thickening of your hair, rapid growth, and changes in texture. Some women also have hair loss during or after pregnancy, or hair that feels dry or thin. Your hair will most likely return to normal after your baby is born. What to expect at prenatal visits During a routine prenatal visit:  You will be weighed to make sure you and the fetus are growing normally.  Your blood pressure will be taken.  Your abdomen will be measured to track your baby's growth.  The fetal heartbeat will be listened to.  Any test results from the previous visit will be discussed. Your health care provider may ask you:  How you are feeling.  If you are feeling the baby move.  If you have had any abnormal symptoms, such as leaking fluid, bleeding, severe headaches, or abdominal cramping.  If you are using any tobacco products, including cigarettes, chewing tobacco, and electronic cigarettes.  If you have any questions. Other tests that may be performed during your second trimester include:  Blood tests that check for:  Low iron levels (anemia).  High blood sugar that affects pregnant women (gestational diabetes) between 87 and 28 weeks.  Rh antibodies. This  is to check for a protein on red blood cells (Rh factor).  Urine tests to check for infections, diabetes, or protein in the urine.  An ultrasound to confirm the proper growth and development of the baby.  An amniocentesis to check for possible genetic problems.  Fetal screens for spina bifida and Down syndrome.  HIV (human immunodeficiency virus) testing. Routine prenatal testing includes screening for HIV, unless you choose not to have this test. Follow these instructions at  home: Medicines   Follow your health care provider's instructions regarding medicine use. Specific medicines may be either safe or unsafe to take during pregnancy.  Take a prenatal vitamin that contains at least 600 micrograms (mcg) of folic acid.  If you develop constipation, try taking a stool softener if your health care provider approves. Eating and drinking   Eat a balanced diet that includes fresh fruits and vegetables, whole grains, good sources of protein such as meat, eggs, or tofu, and low-fat dairy. Your health care provider will help you determine the amount of weight gain that is right for you.  Avoid raw meat and uncooked cheese. These carry germs that can cause birth defects in the baby.  If you have low calcium intake from food, talk to your health care provider about whether you should take a daily calcium supplement.  Limit foods that are high in fat and processed sugars, such as fried and sweet foods.  To prevent constipation:  Drink enough fluid to keep your urine clear or pale yellow.  Eat foods that are high in fiber, such as fresh fruits and vegetables, whole grains, and beans. Activity   Exercise only as directed by your health care provider. Most women can continue their usual exercise routine during pregnancy. Try to exercise for 30 minutes at least 5 days a week. Stop exercising if you experience uterine contractions.  Avoid heavy lifting, wear low heel shoes, and practice good posture.  A sexual relationship may be continued unless your health care provider directs you otherwise. Relieving pain and discomfort   Wear a good support bra to prevent discomfort from breast tenderness.  Take warm sitz baths to soothe any pain or discomfort caused by hemorrhoids. Use hemorrhoid cream if your health care provider approves.  Rest with your legs elevated if you have leg cramps or low back pain.  If you develop varicose veins, wear support hose. Elevate your  feet for 15 minutes, 3-4 times a day. Limit salt in your diet. Prenatal Care   Write down your questions. Take them to your prenatal visits.  Keep all your prenatal visits as told by your health care provider. This is important. Safety   Wear your seat belt at all times when driving.  Make a list of emergency phone numbers, including numbers for family, friends, the hospital, and police and fire departments. General instructions   Ask your health care provider for a referral to a local prenatal education class. Begin classes no later than the beginning of month 6 of your pregnancy.  Ask for help if you have counseling or nutritional needs during pregnancy. Your health care provider can offer advice or refer you to specialists for help with various needs.  Do not use hot tubs, steam rooms, or saunas.  Do not douche or use tampons or scented sanitary pads.  Do not cross your legs for long periods of time.  Avoid cat litter boxes and soil used by cats. These carry germs that can cause birth defects  in the baby and possibly loss of the fetus by miscarriage or stillbirth.  Avoid all smoking, herbs, alcohol, and unprescribed drugs. Chemicals in these products can affect the formation and growth of the baby.  Do not use any products that contain nicotine or tobacco, such as cigarettes and e-cigarettes. If you need help quitting, ask your health care provider.  Visit your dentist if you have not gone yet during your pregnancy. Use a soft toothbrush to brush your teeth and be gentle when you floss. Contact a health care provider if:  You have dizziness.  You have mild pelvic cramps, pelvic pressure, or nagging pain in the abdominal area.  You have persistent nausea, vomiting, or diarrhea.  You have a bad smelling vaginal discharge.  You have pain when you urinate. Get help right away if:  You have a fever.  You are leaking fluid from your vagina.  You have spotting or bleeding  from your vagina.  You have severe abdominal cramping or pain.  You have rapid weight gain or weight loss.  You have shortness of breath with chest pain.  You notice sudden or extreme swelling of your face, hands, ankles, feet, or legs.  You have not felt your baby move in over an hour.  You have severe headaches that do not go away when you take medicine.  You have vision changes. Summary  The second trimester is from week 14 through week 27 (months 4 through 6). It is also a time when the fetus is growing rapidly.  Your body goes through many changes during pregnancy. The changes vary from woman to woman.  Avoid all smoking, herbs, alcohol, and unprescribed drugs. These chemicals affect the formation and growth your baby.  Do not use any tobacco products, such as cigarettes, chewing tobacco, and e-cigarettes. If you need help quitting, ask your health care provider.  Contact your health care provider if you have any questions. Keep all prenatal visits as told by your health care provider. This is important. This information is not intended to replace advice given to you by your health care provider. Make sure you discuss any questions you have with your health care provider. Document Released: 02/26/2001 Document Revised: 08/10/2015 Document Reviewed: 05/05/2012 Elsevier Interactive Patient Education  2017 [employer].

## 2016-07-25 NOTE — Progress Notes (Signed)
Lauren Douglas is a 26 y.o. G1P0000 at 6070w3d for routine follow up.  She reports she is doing well.  She reports compliance with PNV.  +FM.  Denies LOF, vaginal discharge, contractions or abdominal pain.  She reports good PO intake.  No dysuria but reports frequency.   See flow sheet for details. Blood pressure (!) 99/58, pulse 98, temperature 97.9 F (36.6 C), weight 153 lb (69.4 kg), last menstrual period 01/15/2016. Gen: awake, alert, well appearing, NAD Cardio: RRR Pulm: CTAB, normal WOB on room air GU: gravid uterus Ext: no edema, no suprapubic TTP  Results for orders placed or performed in visit on 07/25/16 (from the past 24 hour(s))  POCT urinalysis dipstick     Status: Abnormal   Collection Time: 07/25/16  1:45 PM  Result Value Ref Range   Color, UA yellow yellow   Clarity, UA cloudy (A) clear   Glucose, UA negative negative mg/dL   Bilirubin, UA negative negative   Ketones, POC UA negative negative mg/dL   Spec Grav, UA [id number] - 1.025   Blood, UA negative negative   pH, UA 6.0 5.0 - 8.0   Protein Ur, POC negative negative mg/dL   Urobilinogen, UA 0.2 0.2 or 1.0 E.U./dL   Nitrite, UA Negative Negative   Leukocytes, UA Large (3+) (A) Negative    A/P: Pregnancy at 5370w3d.  Doing well.   Pregnancy issues include UTI during pregnancy. Orders Placed This Encounter  Procedures  . Culture, OB Urine  . POCT urinalysis dipstick  . POCT UA - Microscopic Only   Meds ordered this encounter  Medications  . cephALEXin (KEFLEX) 500 MG capsule    Sig: Take 1 capsule (500 mg total) by mouth 2 (two) times daily.    Dispense:  14 capsule    Refill:  0   Childbirth and education classes were offered.  She is already in classes. Will need CBC, HIV, RPR at next visit. 3h GTT performed on 07/15/16 and was normal. Preterm labor precautions reviewed. Follow up 2 weeks.  UTI was precepted.  Lauren Douglas M. Nadine CountsGottschalk, DO PGY-3, [facility]

## 2016-07-27 LAB — URINE CULTURE, OB REFLEX

## 2016-07-27 LAB — CULTURE, OB URINE

## 2016-07-28 LAB — CULTURE, OB URINE

## 2016-08-15 ENCOUNTER — Ambulatory Visit (INDEPENDENT_AMBULATORY_CARE_PROVIDER_SITE_OTHER): Payer: 59 | Admitting: Family Medicine

## 2016-08-15 VITALS — BP 112/60 | HR 93 | Temp 98.5°F | Wt 160.6 lb

## 2016-08-15 DIAGNOSIS — Z3402 Encounter for supervision of normal first pregnancy, second trimester: Secondary | ICD-10-CM

## 2016-08-15 DIAGNOSIS — Z3493 Encounter for supervision of normal pregnancy, unspecified, third trimester: Secondary | ICD-10-CM

## 2016-08-15 NOTE — Patient Instructions (Signed)
It was nice seeing you today. Your belly exam is normal. During pregnancy baby's growth may push out your navel a bit. Please go to the ED if you having moderate to sever pain or with nausea or vomiting. We will see you back in 2 weeks.  Prenatal Care WHAT IS PRENATAL CARE? Prenatal care is the process of caring for a pregnant woman before she gives birth. Prenatal care makes sure that she and her baby remain as healthy as possible throughout pregnancy. Prenatal care may be provided by a midwife, family practice health care provider, or a childbirth and pregnancy specialist (obstetrician). Prenatal care may include physical examinations, testing, treatments, and education on nutrition, lifestyle, and social support services. WHY IS PRENATAL CARE SO IMPORTANT? Early and consistent prenatal care increases the chance that you and your baby will remain healthy throughout your pregnancy. This type of care also decreases a baby's risk of being born too early (prematurely), or being born smaller than expected (small for gestational age). Any underlying medical conditions you may have that could pose a risk during your pregnancy are discussed during prenatal care visits. You will also be monitored regularly for any new conditions that may arise during your pregnancy so they can be treated quickly and effectively. WHAT HAPPENS DURING PRENATAL CARE VISITS? Prenatal care visits may include the following: Discussion Tell your health care provider about any new signs or symptoms you have experienced since your last visit. These might include:  Nausea or vomiting.  Increased or decreased level of energy.  Difficulty sleeping.  Back or leg pain.  Weight changes.  Frequent urination.  Shortness of breath with physical activity.  Changes in your skin, such as the development of a rash or itchiness.  Vaginal discharge or bleeding.  Feelings of excitement or nervousness.  Changes in your baby's  movements.  You may want to write down any questions or topics you want to discuss with your health care provider and bring them with you to your appointment. Examination During your first prenatal care visit, you will likely have a complete physical exam. Your health care provider will often examine your vagina, cervix, and the position of your uterus, as well as check your heart, lungs, and other body systems. As your pregnancy progresses, your health care provider will measure the size of your uterus and your baby's position inside your uterus. He or she may also examine you for early signs of labor. Your prenatal visits may also include checking your blood pressure and, after about 10-12 weeks of pregnancy, listening to your baby's heartbeat. Testing Regular testing often includes:  Urinalysis. This checks your urine for glucose, protein, or signs of infection.  Blood count. This checks the levels of white and red blood cells in your body.  Tests for sexually transmitted infections (STIs). Testing for STIs at the beginning of pregnancy is routinely done and is required in many states.  Antibody testing. You will be checked to see if you are immune to certain illnesses, such as rubella, that can affect a developing fetus.  Glucose screen. Around 24-28 weeks of pregnancy, your blood glucose level will be checked for signs of gestational diabetes. Follow-up tests may be recommended.  Group B strep. This is a bacteria that is commonly found inside a woman's vagina. This test will inform your health care provider if you need an antibiotic to reduce the amount of this bacteria in your body prior to labor and childbirth.  Ultrasound. Many pregnant women undergo  an ultrasound screening around 18-20 weeks of pregnancy to evaluate the health of the fetus and check for any developmental abnormalities.  HIV (human immunodeficiency virus) testing. Early in your pregnancy, you will be screened for HIV.  If you are at high risk for HIV, this test may be repeated during your third trimester of pregnancy.  You may be offered other testing based on your age, personal or family medical history, or other factors. HOW OFTEN SHOULD I PLAN TO SEE MY HEALTH CARE PROVIDER FOR PRENATAL CARE? Your prenatal care check-up schedule depends on any medical conditions you have before, or develop during, your pregnancy. If you do not have any underlying medical conditions, you will likely be seen for checkups:  Monthly, during the first 6 months of pregnancy.  Twice a month during months 7 and 8 of pregnancy.  Weekly starting in the 9th month of pregnancy and until delivery.  If you develop signs of early labor or other concerning signs or symptoms, you may need to see your health care provider more often. Ask your health care provider what prenatal care schedule is best for you. WHAT CAN I DO TO KEEP MYSELF AND MY BABY AS HEALTHY AS POSSIBLE DURING MY PREGNANCY?  Take a prenatal vitamin containing 400 micrograms (0.4 mg) of folic acid every day. Your health care provider may also ask you to take additional vitamins such as iodine, vitamin D, iron, copper, and zinc.  Take 1500-2000 mg of calcium daily starting at your 20th week of pregnancy until you deliver your baby.  Make sure you are up to date on your vaccinations. Unless directed otherwise by your health care provider: ? You should receive a tetanus, diphtheria, and pertussis (Tdap) vaccination between the 27th and 36th week of your pregnancy, regardless of when your last Tdap immunization occurred. This helps protect your baby from whooping cough (pertussis) after he or she is born. ? You should receive an annual inactivated influenza vaccine (IIV) to help protect you and your baby from influenza. This can be done at any point during your pregnancy.  Eat a well-rounded diet that includes: ? Fresh fruits and vegetables. ? Lean proteins. ? Calcium-rich  foods such as milk, yogurt, hard cheeses, and dark, leafy greens. ? Whole grain breads.  Do noteat seafood high in mercury, including: ? Swordfish. ? Tilefish. ? Shark. ? King mackerel. ? More than 6 oz tuna per week.  Do not eat: ? Raw or undercooked meats or eggs. ? Unpasteurized foods, such as soft cheeses (brie, blue, or feta), juices, and milks. ? Lunch meats. ? Hot dogs that have not been heated until they are steaming.  Drink enough water to keep your urine clear or pale yellow. For many women, this may be 10 or more 8 oz glasses of water each day. Keeping yourself hydrated helps deliver nutrients to your baby and may prevent the start of pre-term uterine contractions.  Do not use any tobacco products including cigarettes, chewing tobacco, or electronic cigarettes. If you need help quitting, ask your health care provider.  Do not drink beverages containing alcohol. No safe level of alcohol consumption during pregnancy has been determined.  Do not use any illegal drugs. These can harm your developing baby or cause a miscarriage.  Ask your health care provider or pharmacist before taking any prescription or over-the-counter medicines, herbs, or supplements.  Limit your caffeine intake to no more than 200 mg per day.  Exercise. Unless told otherwise by your health care  provider, try to get 30 minutes of moderate exercise most days of the week. Do not  do high-impact activities, contact sports, or activities with a high risk of falling, such as horseback riding or downhill skiing.  Get plenty of rest.  Avoid anything that raises your body temperature, such as hot tubs and saunas.  If you own a cat, do not empty its litter box. Bacteria contained in cat feces can cause an infection called toxoplasmosis. This can result in serious harm to the fetus.  Stay away from chemicals such as insecticides, lead, mercury, and cleaning or paint products that contain solvents.  Do not have  any X-rays taken unless medically necessary.  Take a childbirth and breastfeeding preparation class. Ask your health care provider if you need a referral or recommendation.  This information is not intended to replace advice given to you by your health care provider. Make sure you discuss any questions you have with your health care provider. Document Released: 03/07/2003 Document Revised: 08/07/2015 Document Reviewed: 05/19/2013 Elsevier Interactive Patient Education  2017 [employer]

## 2016-08-15 NOTE — Progress Notes (Signed)
Lauren Douglas is a 26 y.o. G1P0 at 10926w3d for routine follow up.  She reports mild periumbilical pain with her navel popping out. No N/V, otherwise no stomach pain, no diarrhea or constipation, no blood in her stool. 2 weeks ago she took some medicine that gave her diarrhea. She has been fine since then.   See flow sheet for details. Exam: Heart/Lungs: WNL Abd: Gravid, no tenderness, no periumbilical swelling or erythema or tenderness.  A/P: Pregnancy at 2626w3d.  Doing well.   Pregnancy issues include: Abnormal PAP/ASCUS  Infant feeding choice: Breast feeding preferred. Contraception choice: Undecided Infant circumcision desired not applicable  Tdapwas not given today. CBC, RPR, and HIV were done today.   Pregnancy medical home forms were done today and reviewed. No concern.  RH status was reviewed and pt does not need Rhogam.  Rhogam was not given today.  Patient reassured that umbilical discomfort may be related to fetal growth. No umbilical hernia. She is advised to go to the MAU if she has worsening of her symptoms. No Quad result. Per patient she refused to get lab drawn the day it was ordered since she does not want Quad screening done. Per previous visit note she had urinary symptoms with neg urine culture. Currently no GU symptoms. No need for repeat urine culture. F/U repeat PAP 12 months after the last PAP done in 2017.  Childbirth and education classes were not offered. Preterm labor precautions reviewed. Kick counts reviewed. Follow up 2 weeks.

## 2016-08-16 ENCOUNTER — Encounter: Payer: Self-pay | Admitting: Family Medicine

## 2016-08-16 LAB — CBC WITH DIFFERENTIAL/PLATELET
Basophils Absolute: 0 10*3/uL (ref 0.0–0.2)
Basos: 0 %
EOS (ABSOLUTE): 0.2 10*3/uL (ref 0.0–0.4)
Eos: 3 %
HEMATOCRIT: 35.7 % (ref 34.0–46.6)
Hemoglobin: 11.8 g/dL (ref 11.1–15.9)
IMMATURE GRANS (ABS): 0.1 10*3/uL (ref 0.0–0.1)
Immature Granulocytes: 1 %
LYMPHS ABS: 1.5 10*3/uL (ref 0.7–3.1)
LYMPHS: 21 %
MCH: 30.1 pg (ref 26.6–33.0)
MCHC: 33.1 g/dL (ref 31.5–35.7)
MCV: 91 fL (ref 79–97)
MONOCYTES: 6 %
Monocytes Absolute: 0.5 10*3/uL (ref 0.1–0.9)
NEUTROS ABS: 5 10*3/uL (ref 1.4–7.0)
Neutrophils: 69 %
Platelets: 249 10*3/uL (ref 150–379)
RBC: 3.92 x10E6/uL (ref 3.77–5.28)
RDW: 13.3 % (ref 12.3–15.4)
WBC: 7.3 10*3/uL (ref 3.4–10.8)

## 2016-08-16 LAB — HIV ANTIBODY (ROUTINE TESTING W REFLEX): HIV Screen 4th Generation wRfx: NONREACTIVE

## 2016-08-16 LAB — RPR: RPR: NONREACTIVE

## 2016-08-29 ENCOUNTER — Encounter: Payer: Self-pay | Admitting: Family Medicine

## 2016-08-29 ENCOUNTER — Ambulatory Visit (INDEPENDENT_AMBULATORY_CARE_PROVIDER_SITE_OTHER): Payer: 59 | Admitting: Family Medicine

## 2016-08-29 ENCOUNTER — Inpatient Hospital Stay (HOSPITAL_COMMUNITY)
Admission: AD | Admit: 2016-08-29 | Discharge: 2016-08-29 | Disposition: A | Discharge status: Home / Self Care | Payer: Medicaid Other | Source: Ambulatory Visit | Attending: Obstetrics & Gynecology | Admitting: Obstetrics & Gynecology

## 2016-08-29 ENCOUNTER — Encounter (HOSPITAL_COMMUNITY): Payer: Self-pay | Admitting: *Deleted

## 2016-08-29 VITALS — BP 116/60 | HR 92 | Temp 98.2°F | Ht 62.0 in | Wt 163.0 lb

## 2016-08-29 DIAGNOSIS — O368130 Decreased fetal movements, third trimester, not applicable or unspecified: Secondary | ICD-10-CM

## 2016-08-29 DIAGNOSIS — Z3403 Encounter for supervision of normal first pregnancy, third trimester: Secondary | ICD-10-CM

## 2016-08-29 DIAGNOSIS — O368190 Decreased fetal movements, unspecified trimester, not applicable or unspecified: Secondary | ICD-10-CM | POA: Diagnosis not present

## 2016-08-29 DIAGNOSIS — Z3A32 32 weeks gestation of pregnancy: Secondary | ICD-10-CM | POA: Diagnosis not present

## 2016-08-29 LAB — URINALYSIS, ROUTINE W REFLEX MICROSCOPIC
Bilirubin Urine: NEGATIVE
Glucose, UA: NEGATIVE mg/dL
HGB URINE DIPSTICK: NEGATIVE
Ketones, ur: NEGATIVE mg/dL
Leukocytes, UA: NEGATIVE
Nitrite: NEGATIVE
Protein, ur: NEGATIVE mg/dL
SPECIFIC GRAVITY, URINE: 1.012 (ref 1.005–1.030)
pH: 7 (ref 5.0–8.0)

## 2016-08-29 MED ORDER — LACTATED RINGERS IV BOLUS (SEPSIS)
1000.0000 mL | Freq: Once | INTRAVENOUS | Status: AC
  Administered 2016-08-29: 1000 mL via INTRAVENOUS

## 2016-08-29 MED ORDER — NIFEDIPINE 10 MG PO CAPS
10.0000 mg | ORAL_CAPSULE | Freq: Once | ORAL | Status: AC
  Administered 2016-08-29: 10 mg via ORAL
  Filled 2016-08-29: qty 1

## 2016-08-29 NOTE — MAU Note (Signed)
[facility] Mentioned had not felt baby move much in past 3 days so sent here for FM. Baby is active currently and pt is aware.

## 2016-08-29 NOTE — MAU Note (Signed)
Pt reports she has had decreased fetal movement for the past 3 days. denies any  pain or cramping.

## 2016-08-29 NOTE — Discharge Instructions (Signed)

## 2016-08-29 NOTE — Progress Notes (Signed)
Pt unaware of ctxs. 

## 2016-08-29 NOTE — MAU Provider Note (Signed)
History     CSN: [id number]  Arrival date and time: 08/29/16 1215   First Provider Initiated Contact with Patient 08/29/16 1311      Chief Complaint  Patient presents with  . Decreased Fetal Movement   Lauren Douglas is a 26 y.o. G1P0 at [redacted]w[redacted]d sent here from [facility] where she had routine prenatal visit today and reported decreased fetal movement for 3 days. She describes same baseline frequency of movements but kicks are not as strong as they had been. Since on monitor here she's feeling normal amount of fetal movement. Last intercourse at 03 30. Denies irritative or abnormal appearing vaginal discharge. Denies contractions, abdominal pain, leakage of fluid or vaginal bleeding.  Pregnancy course has been essentially uncomplicated     GYN HX neg for STIs  History reviewed. No pertinent past medical history.  History reviewed. No pertinent surgical history.  Family History  Problem Relation Age of Onset  . Diabetes Neg Hx   . Hypertension Neg Hx     Social History  Substance Use Topics  . Smoking status: Never Smoker  . Smokeless tobacco: Never Used  . Alcohol use No    Allergies: No Known Allergies  Prescriptions Prior to Admission  Medication Sig Dispense Refill Last Dose  . acetaminophen (TYLENOL) 500 MG tablet Take 1 tablet (500 mg total) by mouth every 6 (six) hours as needed. 30 tablet 0   . cephALEXin (KEFLEX) 500 MG capsule Take 1 capsule (500 mg total) by mouth 2 (two) times daily. 14 capsule 0   . docusate sodium (COLACE) 100 MG capsule Take 1 capsule (100 mg total) by mouth 2 (two) times daily. 10 capsule 0   . Prenatal Vit-Fe Fumarate-FA (PRENATAL VITAMIN) 27-0.8 MG TABS Take 1 tablet by mouth daily. 90 tablet 3     Review of Systems  Constitutional: Negative for chills, fatigue and fever.  Cardiovascular: Negative for chest pain.  Gastrointestinal: Negative for abdominal pain.  Genitourinary: Negative for dysuria, flank pain, frequency,  hematuria, urgency, vaginal bleeding and vaginal discharge.  Musculoskeletal: Negative for back pain.  Neurological: Negative for syncope and light-headedness.  Psychiatric/Behavioral: Negative for dysphoric mood. The patient is not nervous/anxious.    Physical Exam   Blood pressure 116/69, pulse 86, temperature 98.7 F (37.1 C), resp. rate 18, last menstrual period 01/15/2016.  Physical Exam  Nursing note and vitals reviewed. Constitutional: She is oriented to person, place, and time. She appears well-developed and well-nourished. No distress.  HENT:  Head: Normocephalic.  Eyes: No scleral icterus.  Neck: Neck supple. No thyromegaly present.  Cardiovascular: Normal rate.   Respiratory: Effort normal.  GI: Soft. There is no tenderness.  Abdomen consistent with [redacted] week GA  Genitourinary: Vagina normal.  Genitourinary Comments: SVE: posterior, ext os 1cm, int os closed, long, -3 cephalic  Musculoskeletal: Normal range of motion.  Neurological: She is alert and oriented to person, place, and time.    MAU Course  Procedures    Fetal monitoring Baseline FHR 130, moderate variability, accelerations present, no decelerations Toco: Low amplitude UC's every 3-6 minutes.  Results for orders placed or performed during the hospital encounter of 08/29/16 (from the past 24 hour(s))  Urinalysis, Routine w reflex microscopic     Status: None   Collection Time: 08/29/16  1:25 PM  Result Value Ref Range   Color, Urine YELLOW YELLOW   APPearance CLEAR CLEAR   Specific Gravity, Urine 1.012 1.005 - 1.030   pH 7.0 5.0 - 8.0  Glucose, UA NEGATIVE NEGATIVE mg/dL   Hgb urine dipstick NEGATIVE NEGATIVE   Bilirubin Urine NEGATIVE NEGATIVE   Ketones, ur NEGATIVE NEGATIVE mg/dL   Protein, ur NEGATIVE NEGATIVE mg/dL   Nitrite NEGATIVE NEGATIVE   Leukocytes, UA NEGATIVE NEGATIVE   Oral fluids and IVF LR 1000 given> still having unperceived mild irregular UCs Procardia 10mg  SQ given> no uterine  activity  Bedside US by me: normal AFV  Assessment and Plan  G1 at 8152w3d 1. Decreased fetal movement affecting management of pregnancy, antepartum, single or unspecified fetus    Reassuring fetal status by EFM, US and exam, maternal perception of fetal movement normal Braxton Hicks contractions persistent  Discharged home with reassurance and PTL precautions: FM perception discussed> return if contractions perceived or decreased FM  Allergies as of 08/29/2016   No Known Allergies     Medication List    STOP taking these medications   cephALEXin 500 MG capsule Commonly known as:  KEFLEX   docusate sodium 100 MG capsule Commonly known as:  COLACE     TAKE these medications   acetaminophen 500 MG tablet Commonly known as:  TYLENOL Take 1 tablet (500 mg total) by mouth every 6 (six) hours as needed.   Prenatal Vitamin 27-0.8 MG Tabs Take 1 tablet by mouth daily.         Hughie Melroy [name] 08/29/2016, 1:15 PM

## 2016-08-29 NOTE — Progress Notes (Signed)
Lauren Douglas is a 26 y.o. G1P0000 at 5945w3d for routine follow up.  She reports decreased fetal movement over the last 2-3 days.  She notes that she has been feeling a little dehydrated and easily overheated.  She reports good UOP.  No cramping/ contractions/ LOF/ vaginal bleeding/ vaginal discharge.  She reports taking PNV daily.   See flow sheet for details. Gen: awake, alert, well appearing female, NAD Cardio: RRR, no murmurs Pulm: CTAB, normal WOB Abd: gravid, nontender  A/P: Pregnancy at 6645w3d.  Doing well.   Pregnancy issues include decreased fetal movement.  HR reassuring but will send to MAU for NST for further evaluation.  Infant feeding choice Breast Contraception choice undecided Infant circumcision desired not applicable  Tdapwas not given today. Already administered. GBS/GC/CZ testing was not performed today.  This will need to be performed at 36w  Preterm labor precautions reviewed. Safe sleep discussed. Kick counts reviewed. Follow up 2 weeks.

## 2016-08-29 NOTE — Progress Notes (Signed)
D. Poe [name] in to discuss d/c plan. Informal bedside u/s by [name] showed good fld level. Written and verbal d/c instructions given and understanding voiced.

## 2016-08-29 NOTE — Patient Instructions (Addendum)
Consider getting a pregnancy cradle/ maternity belt to help your ligament pain in the belly.  I discussed your care with the OB provider and they recommend that you go to the [facility] for [facility]  Follow up in 2 weeks with Dr [facility].

## 2016-08-29 NOTE — Progress Notes (Signed)
Dr Mosetta PuttFeng called and notified of pt's admission and assessment. States MAU provider will see pt. Since is not a labor ck

## 2016-08-30 ENCOUNTER — Ambulatory Visit: Payer: 59 | Admitting: Family Medicine

## 2016-09-12 ENCOUNTER — Ambulatory Visit (INDEPENDENT_AMBULATORY_CARE_PROVIDER_SITE_OTHER): Payer: 59 | Admitting: Family Medicine

## 2016-09-12 VITALS — BP 108/68 | HR 108 | Temp 98.1°F | Wt 168.8 lb

## 2016-09-12 DIAGNOSIS — Z3493 Encounter for supervision of normal pregnancy, unspecified, third trimester: Secondary | ICD-10-CM

## 2016-09-12 NOTE — Progress Notes (Signed)
Christan Morgenthaler is a 26 y.o. G 1 P 0 at 3070w3d for routine follow up.  She reports worsening a ligament pain. Has not gotten maternity belt due to cost. Denies vaginal bleeding, vaginal discharge, contractions, loss of fluid. Does note swelling in her legs bilaterally, improved with elevation. Denies dizziness, blurred vision, headache. Continues to note fetal movement..  See flow sheet for details.  A/P: Pregnancy at 6070w3d.  Doing well.   Pregnancy issues include round ligament pain.  Infant feeding choice breast Contraception choice undecided Infant circumcision desired not applicable  Tdap given at previous office visit. Will need GBS/GC/CZ testing at next office visit.  Encouraged to by maternity belt for pelvic discomfort. Found more affordable options on [employer], which she states she can afford.   Preterm labor precautions reviewed. Safe sleep discussed. Kick counts reviewed. Follow up 2 weeks.

## 2016-09-12 NOTE — Patient Instructions (Signed)
[facility]. This may provide some comfort. Please return in 2 weeks. You will need some labs at that visit.  Dr. Caroleen Hammanumley   Third Trimester of Pregnancy The third trimester is from week 28 through week 40 (months 7 through 9). The third trimester is a time when the unborn baby (fetus) is growing rapidly. At the end of the ninth month, the fetus is about 20 inches in length and weighs 6-10 pounds. Body changes during your third trimester Your body will continue to go through many changes during pregnancy. The changes vary from woman to woman. During the third trimester:  Your weight will continue to increase. You can expect to gain 25-35 pounds (11-16 kg) by the end of the pregnancy.  You may begin to get stretch marks on your hips, abdomen, and breasts.  You may urinate more often because the fetus is moving lower into your pelvis and pressing on your bladder.  You may develop or continue to have heartburn. This is caused by increased hormones that slow down muscles in the digestive tract.  You may develop or continue to have constipation because increased hormones slow digestion and cause the muscles that push waste through your intestines to relax.  You may develop hemorrhoids. These are swollen veins (varicose veins) in the rectum that can itch or be painful.  You may develop swollen, bulging veins (varicose veins) in your legs.  You may have increased body aches in the pelvis, back, or thighs. This is due to weight gain and increased hormones that are relaxing your joints.  You may have changes in your hair. These can include thickening of your hair, rapid growth, and changes in texture. Some women also have hair loss during or after pregnancy, or hair that feels dry or thin. Your hair will most likely return to normal after your baby is born.  Your breasts will continue to grow and they will continue to become tender. A yellow fluid (colostrum) may leak from  your breasts. This is the first milk you are producing for your baby.  Your belly button may stick out.  You may notice more swelling in your hands, face, or ankles.  You may have increased tingling or numbness in your hands, arms, and legs. The skin on your belly may also feel numb.  You may feel short of breath because of your expanding uterus.  You may have more problems sleeping. This can be caused by the size of your belly, increased need to urinate, and an increase in your body's metabolism.  You may notice the fetus "dropping," or moving lower in your abdomen (lightening).  You may have increased vaginal discharge.  You may notice your joints feel loose and you may have pain around your pelvic bone.  What to expect at prenatal visits You will have prenatal exams every 2 weeks until week 36. Then you will have weekly prenatal exams. During a routine prenatal visit:  You will be weighed to make sure you and the baby are growing normally.  Your blood pressure will be taken.  Your abdomen will be measured to track your baby's growth.  The fetal heartbeat will be listened to.  Any test results from the previous visit will be discussed.  You may have a cervical check near your due date to see if your cervix has softened or thinned (effaced).  You will be tested for Group B streptococcus. This happens between 35 and 37 weeks.  Your health  care provider may ask you:  What your birth plan is.  How you are feeling.  If you are feeling the baby move.  If you have had any abnormal symptoms, such as leaking fluid, bleeding, severe headaches, or abdominal cramping.  If you are using any tobacco products, including cigarettes, chewing tobacco, and electronic cigarettes.  If you have any questions.  Other tests or screenings that may be performed during your third trimester include:  Blood tests that check for low iron levels (anemia).  Fetal testing to check the health,  activity level, and growth of the fetus. Testing is done if you have certain medical conditions or if there are problems during the pregnancy.  Nonstress test (NST). This test checks the health of your baby to make sure there are no signs of problems, such as the baby not getting enough oxygen. During this test, a belt is placed around your belly. The baby is made to move, and its heart rate is monitored during movement.  What is false labor? False labor is a condition in which you feel small, irregular tightenings of the muscles in the womb (contractions) that usually go away with rest, changing position, or drinking water. These are called Braxton Hicks contractions. Contractions may last for hours, days, or even weeks before true labor sets in. If contractions come at regular intervals, become more frequent, increase in intensity, or become painful, you should see your health care provider. What are the signs of labor?  Abdominal cramps.  Regular contractions that start at 10 minutes apart and become stronger and more frequent with time.  Contractions that start on the top of the uterus and spread down to the lower abdomen and back.  Increased pelvic pressure and dull back pain.  A watery or bloody mucus discharge that comes from the vagina.  Leaking of amniotic fluid. This is also known as your "water breaking." It could be a slow trickle or a gush. Let your health care provider know if it has a color or strange odor. If you have any of these signs, call your health care provider right away, even if it is before your due date. Follow these instructions at home: Medicines  Follow your health care provider's instructions regarding medicine use. Specific medicines may be either safe or unsafe to take during pregnancy.  Take a prenatal vitamin that contains at least 600 micrograms (mcg) of folic acid.  If you develop constipation, try taking a stool softener if your health care provider  approves. Eating and drinking  Eat a balanced diet that includes fresh fruits and vegetables, whole grains, good sources of protein such as meat, eggs, or tofu, and low-fat dairy. Your health care provider will help you determine the amount of weight gain that is right for you.  Avoid raw meat and uncooked cheese. These carry germs that can cause birth defects in the baby.  If you have low calcium intake from food, talk to your health care provider about whether you should take a daily calcium supplement.  Eat four or five small meals rather than three large meals a day.  Limit foods that are high in fat and processed sugars, such as fried and sweet foods.  To prevent constipation: ? Drink enough fluid to keep your urine clear or pale yellow. ? Eat foods that are high in fiber, such as fresh fruits and vegetables, whole grains, and beans. Activity  Exercise only as directed by your health care provider. Most women can  continue their usual exercise routine during pregnancy. Try to exercise for 30 minutes at least 5 days a week. Stop exercising if you experience uterine contractions.  Avoid heavy lifting.  Do not exercise in extreme heat or humidity, or at high altitudes.  Wear low-heel, comfortable shoes.  Practice good posture.  You may continue to have sex unless your health care provider tells you otherwise. Relieving pain and discomfort  Take frequent breaks and rest with your legs elevated if you have leg cramps or low back pain.  Take warm sitz baths to soothe any pain or discomfort caused by hemorrhoids. Use hemorrhoid cream if your health care provider approves.  Wear a good support bra to prevent discomfort from breast tenderness.  If you develop varicose veins: ? Wear support pantyhose or compression stockings as told by your healthcare provider. ? Elevate your feet for 15 minutes, 3-4 times a day. Prenatal care  Write down your questions. Take them to your prenatal  visits.  Keep all your prenatal visits as told by your health care provider. This is important. Safety  Wear your seat belt at all times when driving.  Make a list of emergency phone numbers, including numbers for family, friends, the hospital, and police and fire departments. General instructions  Avoid cat litter boxes and soil used by cats. These carry germs that can cause birth defects in the baby. If you have a cat, ask someone to clean the litter box for you.  Do not travel far distances unless it is absolutely necessary and only with the approval of your health care provider.  Do not use hot tubs, steam rooms, or saunas.  Do not drink alcohol.  Do not use any products that contain nicotine or tobacco, such as cigarettes and e-cigarettes. If you need help quitting, ask your health care provider.  Do not use any medicinal herbs or unprescribed drugs. These chemicals affect the formation and growth of the baby.  Do not douche or use tampons or scented sanitary pads.  Do not cross your legs for long periods of time.  To prepare for the arrival of your baby: ? Take prenatal classes to understand, practice, and ask questions about labor and delivery. ? Make a trial run to the hospital. ? Visit the hospital and tour the maternity area. ? Arrange for maternity or paternity leave through employers. ? Arrange for family and friends to take care of pets while you are in the hospital. ? Purchase a rear-facing car seat and make sure you know how to install it in your car. ? Pack your hospital bag. ? Prepare the baby's nursery. Make sure to remove all pillows and stuffed animals from the baby's crib to prevent suffocation.  Visit your dentist if you have not gone during your pregnancy. Use a soft toothbrush to brush your teeth and be gentle when you floss. Contact a health care provider if:  You are unsure if you are in labor or if your water has broken.  You become dizzy.  You  have mild pelvic cramps, pelvic pressure, or nagging pain in your abdominal area.  You have lower back pain.  You have persistent nausea, vomiting, or diarrhea.  You have an unusual or bad smelling vaginal discharge.  You have pain when you urinate. Get help right away if:  Your water breaks before 37 weeks.  You have regular contractions less than 5 minutes apart before 37 weeks.  You have a fever.  You are leaking fluid  from your vagina.  You have spotting or bleeding from your vagina.  You have severe abdominal pain or cramping.  You have rapid weight loss or weight gain.  You have shortness of breath with chest pain.  You notice sudden or extreme swelling of your face, hands, ankles, feet, or legs.  Your baby makes fewer than 10 movements in 2 hours.  You have severe headaches that do not go away when you take medicine.  You have vision changes. Summary  The third trimester is from week 28 through week 40, months 7 through 9. The third trimester is a time when the unborn baby (fetus) is growing rapidly.  During the third trimester, your discomfort may increase as you and your baby continue to gain weight. You may have abdominal, leg, and back pain, sleeping problems, and an increased need to urinate.  During the third trimester your breasts will keep growing and they will continue to become tender. A yellow fluid (colostrum) may leak from your breasts. This is the first milk you are producing for your baby.  False labor is a condition in which you feel small, irregular tightenings of the muscles in the womb (contractions) that eventually go away. These are called Braxton Hicks contractions. Contractions may last for hours, days, or even weeks before true labor sets in.  Signs of labor can include: abdominal cramps; regular contractions that start at 10 minutes apart and become stronger and more frequent with time; watery or bloody mucus discharge that comes from the  vagina; increased pelvic pressure and dull back pain; and leaking of amniotic fluid. This information is not intended to replace advice given to you by your health care provider. Make sure you discuss any questions you have with your health care provider. Document Released: 02/26/2001 Document Revised: 08/10/2015 Document Reviewed: 05/05/2012 [employer] Interactive Patient Education  2017 [employer]

## 2016-09-26 ENCOUNTER — Ambulatory Visit (INDEPENDENT_AMBULATORY_CARE_PROVIDER_SITE_OTHER): Payer: Medicaid Other | Admitting: Family Medicine

## 2016-09-26 ENCOUNTER — Other Ambulatory Visit (HOSPITAL_COMMUNITY)
Admission: RE | Admit: 2016-09-26 | Discharge: 2016-09-26 | Disposition: A | Discharge status: Home / Self Care | Payer: Medicaid Other | Source: Ambulatory Visit | Attending: Family Medicine | Admitting: Family Medicine

## 2016-09-26 VITALS — BP 102/58 | HR 94 | Temp 97.8°F | Ht 62.0 in | Wt 173.4 lb

## 2016-09-26 DIAGNOSIS — Z3490 Encounter for supervision of normal pregnancy, unspecified, unspecified trimester: Secondary | ICD-10-CM

## 2016-09-26 DIAGNOSIS — Z3403 Encounter for supervision of normal first pregnancy, third trimester: Secondary | ICD-10-CM | POA: Diagnosis present

## 2016-09-26 DIAGNOSIS — Z3491 Encounter for supervision of normal pregnancy, unspecified, first trimester: Secondary | ICD-10-CM

## 2016-09-26 LAB — OB RESULTS CONSOLE GC/CHLAMYDIA: Gonorrhea: NEGATIVE

## 2016-09-26 LAB — OB RESULTS CONSOLE GBS: STREP GROUP B AG: NEGATIVE

## 2016-09-26 NOTE — Patient Instructions (Signed)
Come to the MAU (maternity admission unit) for 1) Strong contractions every 2-3 minutes for at least 1 hour that do no go away when you drink water or take a warm shower. These contractions will be so strong all you can do is breath through them 2) Vaginal bleeding- anything more than spotting 3) Loss of fluid like you broke your water 4) Decreased movement of your baby     Third Trimester of Pregnancy The third trimester is from week 28 through week 40 (months 7 through 9). The third trimester is a time when the unborn baby (fetus) is growing rapidly. At the end of the ninth month, the fetus is about 20 inches in length and weighs 6-10 pounds. Body changes during your third trimester Your body will continue to go through many changes during pregnancy. The changes vary from woman to woman. During the third trimester:  Your weight will continue to increase. You can expect to gain 25-35 pounds (11-16 kg) by the end of the pregnancy.  You may begin to get stretch marks on your hips, abdomen, and breasts.  You may urinate more often because the fetus is moving lower into your pelvis and pressing on your bladder.  You may develop or continue to have heartburn. This is caused by increased hormones that slow down muscles in the digestive tract.  You may develop or continue to have constipation because increased hormones slow digestion and cause the muscles that push waste through your intestines to relax.  You may develop hemorrhoids. These are swollen veins (varicose veins) in the rectum that can itch or be painful.  You may develop swollen, bulging veins (varicose veins) in your legs.  You may have increased body aches in the pelvis, back, or thighs. This is due to weight gain and increased hormones that are relaxing your joints.  You may have changes in your hair. These can include thickening of your hair, rapid growth, and changes in texture. Some women also have hair loss during or after  pregnancy, or hair that feels dry or thin. Your hair will most likely return to normal after your baby is born.  Your breasts will continue to grow and they will continue to become tender. A yellow fluid (colostrum) may leak from your breasts. This is the first milk you are producing for your baby.  Your belly button may stick out.  You may notice more swelling in your hands, face, or ankles.  You may have increased tingling or numbness in your hands, arms, and legs. The skin on your belly may also feel numb.  You may feel short of breath because of your expanding uterus.  You may have more problems sleeping. This can be caused by the size of your belly, increased need to urinate, and an increase in your body's metabolism.  You may notice the fetus "dropping," or moving lower in your abdomen (lightening).  You may have increased vaginal discharge.  You may notice your joints feel loose and you may have pain around your pelvic bone.  What to expect at prenatal visits You will have prenatal exams every 2 weeks until week 36. Then you will have weekly prenatal exams. During a routine prenatal visit:  You will be weighed to make sure you and the baby are growing normally.  Your blood pressure will be taken.  Your abdomen will be measured to track your baby's growth.  The fetal heartbeat will be listened to.  Any test results from the previous  visit will be discussed.  You may have a cervical check near your due date to see if your cervix has softened or thinned (effaced).  You will be tested for Group B streptococcus. This happens between 35 and 37 weeks.  Your health care provider may ask you:  What your birth plan is.  How you are feeling.  If you are feeling the baby move.  If you have had any abnormal symptoms, such as leaking fluid, bleeding, severe headaches, or abdominal cramping.  If you are using any tobacco products, including cigarettes, chewing tobacco, and  electronic cigarettes.  If you have any questions.  Other tests or screenings that may be performed during your third trimester include:  Blood tests that check for low iron levels (anemia).  Fetal testing to check the health, activity level, and growth of the fetus. Testing is done if you have certain medical conditions or if there are problems during the pregnancy.  Nonstress test (NST). This test checks the health of your baby to make sure there are no signs of problems, such as the baby not getting enough oxygen. During this test, a belt is placed around your belly. The baby is made to move, and its heart rate is monitored during movement.  What is false labor? False labor is a condition in which you feel small, irregular tightenings of the muscles in the womb (contractions) that usually go away with rest, changing position, or drinking water. These are called Braxton Hicks contractions. Contractions may last for hours, days, or even weeks before true labor sets in. If contractions come at regular intervals, become more frequent, increase in intensity, or become painful, you should see your health care provider. What are the signs of labor?  Abdominal cramps.  Regular contractions that start at 10 minutes apart and become stronger and more frequent with time.  Contractions that start on the top of the uterus and spread down to the lower abdomen and back.  Increased pelvic pressure and dull back pain.  A watery or bloody mucus discharge that comes from the vagina.  Leaking of amniotic fluid. This is also known as your "water breaking." It could be a slow trickle or a gush. Let your health care provider know if it has a color or strange odor. If you have any of these signs, call your health care provider right away, even if it is before your due date. Follow these instructions at home: Medicines  Follow your health care provider's instructions regarding medicine use. Specific  medicines may be either safe or unsafe to take during pregnancy.  Take a prenatal vitamin that contains at least 600 micrograms (mcg) of folic acid.  If you develop constipation, try taking a stool softener if your health care provider approves. Eating and drinking  Eat a balanced diet that includes fresh fruits and vegetables, whole grains, good sources of protein such as meat, eggs, or tofu, and low-fat dairy. Your health care provider will help you determine the amount of weight gain that is right for you.  Avoid raw meat and uncooked cheese. These carry germs that can cause birth defects in the baby.  If you have low calcium intake from food, talk to your health care provider about whether you should take a daily calcium supplement.  Eat four or five small meals rather than three large meals a day.  Limit foods that are high in fat and processed sugars, such as fried and sweet foods.  To prevent constipation: ?  Drink enough fluid to keep your urine clear or pale yellow. ? Eat foods that are high in fiber, such as fresh fruits and vegetables, whole grains, and beans. Activity  Exercise only as directed by your health care provider. Most women can continue their usual exercise routine during pregnancy. Try to exercise for 30 minutes at least 5 days a week. Stop exercising if you experience uterine contractions.  Avoid heavy lifting.  Do not exercise in extreme heat or humidity, or at high altitudes.  Wear low-heel, comfortable shoes.  Practice good posture.  You may continue to have sex unless your health care provider tells you otherwise. Relieving pain and discomfort  Take frequent breaks and rest with your legs elevated if you have leg cramps or low back pain.  Take warm sitz baths to soothe any pain or discomfort caused by hemorrhoids. Use hemorrhoid cream if your health care provider approves.  Wear a good support bra to prevent discomfort from breast tenderness.  If  you develop varicose veins: ? Wear support pantyhose or compression stockings as told by your healthcare provider. ? Elevate your feet for 15 minutes, 3-4 times a day. Prenatal care  Write down your questions. Take them to your prenatal visits.  Keep all your prenatal visits as told by your health care provider. This is important. Safety  Wear your seat belt at all times when driving.  Make a list of emergency phone numbers, including numbers for family, friends, the hospital, and police and fire departments. General instructions  Avoid cat litter boxes and soil used by cats. These carry germs that can cause birth defects in the baby. If you have a cat, ask someone to clean the litter box for you.  Do not travel far distances unless it is absolutely necessary and only with the approval of your health care provider.  Do not use hot tubs, steam rooms, or saunas.  Do not drink alcohol.  Do not use any products that contain nicotine or tobacco, such as cigarettes and e-cigarettes. If you need help quitting, ask your health care provider.  Do not use any medicinal herbs or unprescribed drugs. These chemicals affect the formation and growth of the baby.  Do not douche or use tampons or scented sanitary pads.  Do not cross your legs for long periods of time.  To prepare for the arrival of your baby: ? Take prenatal classes to understand, practice, and ask questions about labor and delivery. ? Make a trial run to the hospital. ? Visit the hospital and tour the maternity area. ? Arrange for maternity or paternity leave through employers. ? Arrange for family and friends to take care of pets while you are in the hospital. ? Purchase a rear-facing car seat and make sure you know how to install it in your car. ? Pack your hospital bag. ? Prepare the baby's nursery. Make sure to remove all pillows and stuffed animals from the baby's crib to prevent suffocation.  Visit your dentist if you  have not gone during your pregnancy. Use a soft toothbrush to brush your teeth and be gentle when you floss. Contact a health care provider if:  You are unsure if you are in labor or if your water has broken.  You become dizzy.  You have mild pelvic cramps, pelvic pressure, or nagging pain in your abdominal area.  You have lower back pain.  You have persistent nausea, vomiting, or diarrhea.  You have an unusual or bad smelling vaginal  discharge.  You have pain when you urinate. Get help right away if:  Your water breaks before 37 weeks.  You have regular contractions less than 5 minutes apart before 37 weeks.  You have a fever.  You are leaking fluid from your vagina.  You have spotting or bleeding from your vagina.  You have severe abdominal pain or cramping.  You have rapid weight loss or weight gain.  You have shortness of breath with chest pain.  You notice sudden or extreme swelling of your face, hands, ankles, feet, or legs.  Your baby makes fewer than 10 movements in 2 hours.  You have severe headaches that do not go away when you take medicine.  You have vision changes. Summary  The third trimester is from week 28 through week 40, months 7 through 9. The third trimester is a time when the unborn baby (fetus) is growing rapidly.  During the third trimester, your discomfort may increase as you and your baby continue to gain weight. You may have abdominal, leg, and back pain, sleeping problems, and an increased need to urinate.  During the third trimester your breasts will keep growing and they will continue to become tender. A yellow fluid (colostrum) may leak from your breasts. This is the first milk you are producing for your baby.  False labor is a condition in which you feel small, irregular tightenings of the muscles in the womb (contractions) that eventually go away. These are called Braxton Hicks contractions. Contractions may last for hours, days, or  even weeks before true labor sets in.  Signs of labor can include: abdominal cramps; regular contractions that start at 10 minutes apart and become stronger and more frequent with time; watery or bloody mucus discharge that comes from the vagina; increased pelvic pressure and dull back pain; and leaking of amniotic fluid. This information is not intended to replace advice given to you by your health care provider. Make sure you discuss any questions you have with your health care provider. Document Released: 02/26/2001 Document Revised: 08/10/2015 Document Reviewed: 05/05/2012 [employer] Interactive Patient Education  2017 [employer]

## 2016-09-26 NOTE — Progress Notes (Signed)
Lauren Douglas is a 26 y.o. G1P0000 at 3251w3d for routine follow up.  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 No abdominal pain, cramping, nausea, vomiting. Positive fetal movement. No vaginal discharge or bleeding.   See flow sheet for details.  A/P: Pregnancy at 7151w3d.  Doing well.   Pregnancy issues include round ligament pain which is better today  Infant feeding choice: Breast  Contraception choice: Depo  Infant circumcision desired not applicable  Tdapwas not given today as it was already given.  GBS/GC/CZ testing was performed today.  Preterm labor precautions reviewed. Safe sleep discussed. Kick counts reviewed. Follow up 2 weeks.  Anders Simmondshristina Gambino, MD [facility], PGY-3

## 2016-09-27 LAB — CERVICOVAGINAL ANCILLARY ONLY
Chlamydia: NEGATIVE
Neisseria Gonorrhea: NEGATIVE

## 2016-10-01 LAB — CULTURE, BETA STREP (GROUP B ONLY): Strep Gp B Culture: NEGATIVE

## 2016-10-10 ENCOUNTER — Ambulatory Visit (INDEPENDENT_AMBULATORY_CARE_PROVIDER_SITE_OTHER): Payer: Medicaid Other | Admitting: Family Medicine

## 2016-10-10 VITALS — BP 112/70 | HR 99 | Temp 97.9°F | Wt 176.8 lb

## 2016-10-10 DIAGNOSIS — Z3403 Encounter for supervision of normal first pregnancy, third trimester: Secondary | ICD-10-CM

## 2016-10-10 NOTE — Progress Notes (Signed)
Lauren Douglas is a 26 y.o. G1P0000 at 8445w3d for routine follow up.  She reports no abdominal pain, cramping, nausea, vomiting. Positive fetal movement. No vaginal discharge or bleeding. Continues to have some edema in her ankles when working.  See flow sheet for details.  A/P: Pregnancy at 4245w3d.  Doing well.   Pregnancy issues include round ligament pain   Infant feeding choice: Breast Contraception choice: Depo Infant circumcision desired not applicable GBS/GC/CZ results were reviewed today.   Labor precautions reviewed. Kick counts reviewed. Confirmed baby is vertex with Thayer OhmLeopold maneuver Follow-up in one week

## 2016-10-10 NOTE — Patient Instructions (Signed)
Thank you for coming in today, it was so nice to see you! Today we talked about:   Come to the MAU (maternity admission unit) for 1) Strong contractions every 2-3 minutes for at least 1 hour that do no go away when you drink water or take a warm shower. These contractions will be so strong all you can do is breath through them 2) Vaginal bleeding- anything more than spotting 3) Loss of fluid like you broke your water  4) Decreased movement of your baby    Please follow up in 1 week. You can schedule this appointment at the front desk before you leave or call the clinic.  If you have any questions or concerns, please do not hesitate to call the office at [phone]. You can also message me directly via MyChart.   Sincerely,  Anders Simmonds, MD   Third Trimester of Pregnancy The third trimester is from week 28 through week 40 (months 7 through 9). The third trimester is a time when the unborn baby (fetus) is growing rapidly. At the end of the ninth month, the fetus is about 20 inches in length and weighs 6-10 pounds. Body changes during your third trimester Your body will continue to go through many changes during pregnancy. The changes vary from woman to woman. During the third trimester:  Your weight will continue to increase. You can expect to gain 25-35 pounds (11-16 kg) by the end of the pregnancy.  You may begin to get stretch marks on your hips, abdomen, and breasts.  You may urinate more often because the fetus is moving lower into your pelvis and pressing on your bladder.  You may develop or continue to have heartburn. This is caused by increased hormones that slow down muscles in the digestive tract.  You may develop or continue to have constipation because increased hormones slow digestion and cause the muscles that push waste through your intestines to relax.  You may develop hemorrhoids. These are swollen veins (varicose veins) in the rectum that can itch or be  painful.  You may develop swollen, bulging veins (varicose veins) in your legs.  You may have increased body aches in the pelvis, back, or thighs. This is due to weight gain and increased hormones that are relaxing your joints.  You may have changes in your hair. These can include thickening of your hair, rapid growth, and changes in texture. Some women also have hair loss during or after pregnancy, or hair that feels dry or thin. Your hair will most likely return to normal after your baby is born.  Your breasts will continue to grow and they will continue to become tender. A yellow fluid (colostrum) may leak from your breasts. This is the first milk you are producing for your baby.  Your belly button may stick out.  You may notice more swelling in your hands, face, or ankles.  You may have increased tingling or numbness in your hands, arms, and legs. The skin on your belly may also feel numb.  You may feel short of breath because of your expanding uterus.  You may have more problems sleeping. This can be caused by the size of your belly, increased need to urinate, and an increase in your body's metabolism.  You may notice the fetus "dropping," or moving lower in your abdomen (lightening).  You may have increased vaginal discharge.  You may notice your joints feel loose and you may have pain around your pelvic bone.  What to expect at prenatal visits You will have prenatal exams every 2 weeks until week 36. Then you will have weekly prenatal exams. During a routine prenatal visit:  You will be weighed to make sure you and the baby are growing normally.  Your blood pressure will be taken.  Your abdomen will be measured to track your baby's growth.  The fetal heartbeat will be listened to.  Any test results from the previous visit will be discussed.  You may have a cervical check near your due date to see if your cervix has softened or thinned (effaced).  You will be tested for  Group B streptococcus. This happens between 35 and 37 weeks.  Your health care provider may ask you:  What your birth plan is.  How you are feeling.  If you are feeling the baby move.  If you have had any abnormal symptoms, such as leaking fluid, bleeding, severe headaches, or abdominal cramping.  If you are using any tobacco products, including cigarettes, chewing tobacco, and electronic cigarettes.  If you have any questions.  Other tests or screenings that may be performed during your third trimester include:  Blood tests that check for low iron levels (anemia).  Fetal testing to check the health, activity level, and growth of the fetus. Testing is done if you have certain medical conditions or if there are problems during the pregnancy.  Nonstress test (NST). This test checks the health of your baby to make sure there are no signs of problems, such as the baby not getting enough oxygen. During this test, a belt is placed around your belly. The baby is made to move, and its heart rate is monitored during movement.  What is false labor? False labor is a condition in which you feel small, irregular tightenings of the muscles in the womb (contractions) that usually go away with rest, changing position, or drinking water. These are called Braxton Hicks contractions. Contractions may last for hours, days, or even weeks before true labor sets in. If contractions come at regular intervals, become more frequent, increase in intensity, or become painful, you should see your health care provider. What are the signs of labor?  Abdominal cramps.  Regular contractions that start at 10 minutes apart and become stronger and more frequent with time.  Contractions that start on the top of the uterus and spread down to the lower abdomen and back.  Increased pelvic pressure and dull back pain.  A watery or bloody mucus discharge that comes from the vagina.  Leaking of amniotic fluid. This is  also known as your "water breaking." It could be a slow trickle or a gush. Let your health care provider know if it has a color or strange odor. If you have any of these signs, call your health care provider right away, even if it is before your due date. Follow these instructions at home: Medicines  Follow your health care provider's instructions regarding medicine use. Specific medicines may be either safe or unsafe to take during pregnancy.  Take a prenatal vitamin that contains at least 600 micrograms (mcg) of folic acid.  If you develop constipation, try taking a stool softener if your health care provider approves. Eating and drinking  Eat a balanced diet that includes fresh fruits and vegetables, whole grains, good sources of protein such as meat, eggs, or tofu, and low-fat dairy. Your health care provider will help you determine the amount of weight gain that is right for you.  Avoid raw meat and uncooked cheese. These carry germs that can cause birth defects in the baby.  If you have low calcium intake from food, talk to your health care provider about whether you should take a daily calcium supplement.  Eat four or five small meals rather than three large meals a day.  Limit foods that are high in fat and processed sugars, such as fried and sweet foods.  To prevent constipation: ? Drink enough fluid to keep your urine clear or pale yellow. ? Eat foods that are high in fiber, such as fresh fruits and vegetables, whole grains, and beans. Activity  Exercise only as directed by your health care provider. Most women can continue their usual exercise routine during pregnancy. Try to exercise for 30 minutes at least 5 days a week. Stop exercising if you experience uterine contractions.  Avoid heavy lifting.  Do not exercise in extreme heat or humidity, or at high altitudes.  Wear low-heel, comfortable shoes.  Practice good posture.  You may continue to have sex unless your  health care provider tells you otherwise. Relieving pain and discomfort  Take frequent breaks and rest with your legs elevated if you have leg cramps or low back pain.  Take warm sitz baths to soothe any pain or discomfort caused by hemorrhoids. Use hemorrhoid cream if your health care provider approves.  Wear a good support bra to prevent discomfort from breast tenderness.  If you develop varicose veins: ? Wear support pantyhose or compression stockings as told by your healthcare provider. ? Elevate your feet for 15 minutes, 3-4 times a day. Prenatal care  Write down your questions. Take them to your prenatal visits.  Keep all your prenatal visits as told by your health care provider. This is important. Safety  Wear your seat belt at all times when driving.  Make a list of emergency phone numbers, including numbers for family, friends, the hospital, and police and fire departments. General instructions  Avoid cat litter boxes and soil used by cats. These carry germs that can cause birth defects in the baby. If you have a cat, ask someone to clean the litter box for you.  Do not travel far distances unless it is absolutely necessary and only with the approval of your health care provider.  Do not use hot tubs, steam rooms, or saunas.  Do not drink alcohol.  Do not use any products that contain nicotine or tobacco, such as cigarettes and e-cigarettes. If you need help quitting, ask your health care provider.  Do not use any medicinal herbs or unprescribed drugs. These chemicals affect the formation and growth of the baby.  Do not douche or use tampons or scented sanitary pads.  Do not cross your legs for long periods of time.  To prepare for the arrival of your baby: ? Take prenatal classes to understand, practice, and ask questions about labor and delivery. ? Make a trial run to the hospital. ? Visit the hospital and tour the maternity area. ? Arrange for maternity or  paternity leave through employers. ? Arrange for family and friends to take care of pets while you are in the hospital. ? Purchase a rear-facing car seat and make sure you know how to install it in your car. ? Pack your hospital bag. ? Prepare the baby's nursery. Make sure to remove all pillows and stuffed animals from the baby's crib to prevent suffocation.  Visit your dentist if you have not gone during your pregnancy.  Use a soft toothbrush to brush your teeth and be gentle when you floss. Contact a health care provider if:  You are unsure if you are in labor or if your water has broken.  You become dizzy.  You have mild pelvic cramps, pelvic pressure, or nagging pain in your abdominal area.  You have lower back pain.  You have persistent nausea, vomiting, or diarrhea.  You have an unusual or bad smelling vaginal discharge.  You have pain when you urinate. Get help right away if:  Your water breaks before 37 weeks.  You have regular contractions less than 5 minutes apart before 37 weeks.  You have a fever.  You are leaking fluid from your vagina.  You have spotting or bleeding from your vagina.  You have severe abdominal pain or cramping.  You have rapid weight loss or weight gain.  You have shortness of breath with chest pain.  You notice sudden or extreme swelling of your face, hands, ankles, feet, or legs.  Your baby makes fewer than 10 movements in 2 hours.  You have severe headaches that do not go away when you take medicine.  You have vision changes. Summary  The third trimester is from week 28 through week 40, months 7 through 9. The third trimester is a time when the unborn baby (fetus) is growing rapidly.  During the third trimester, your discomfort may increase as you and your baby continue to gain weight. You may have abdominal, leg, and back pain, sleeping problems, and an increased need to urinate.  During the third trimester your breasts will keep  growing and they will continue to become tender. A yellow fluid (colostrum) may leak from your breasts. This is the first milk you are producing for your baby.  False labor is a condition in which you feel small, irregular tightenings of the muscles in the womb (contractions) that eventually go away. These are called Braxton Hicks contractions. Contractions may last for hours, days, or even weeks before true labor sets in.  Signs of labor can include: abdominal cramps; regular contractions that start at 10 minutes apart and become stronger and more frequent with time; watery or bloody mucus discharge that comes from the vagina; increased pelvic pressure and dull back pain; and leaking of amniotic fluid. This information is not intended to replace advice given to you by your health care provider. Make sure you discuss any questions you have with your health care provider. Document Released: 02/26/2001 Document Revised: 08/10/2015 Document Reviewed: 05/05/2012 Elsevier Interactive Patient Education  2017 [employer]

## 2016-10-18 ENCOUNTER — Ambulatory Visit (INDEPENDENT_AMBULATORY_CARE_PROVIDER_SITE_OTHER): Payer: Medicaid Other | Admitting: Family Medicine

## 2016-10-18 VITALS — BP 110/68 | HR 88 | Temp 98.3°F | Wt 179.6 lb

## 2016-10-18 DIAGNOSIS — Z3403 Encounter for supervision of normal first pregnancy, third trimester: Secondary | ICD-10-CM | POA: Diagnosis present

## 2016-10-18 DIAGNOSIS — O480 Post-term pregnancy: Secondary | ICD-10-CM

## 2016-10-18 NOTE — Progress Notes (Signed)
Lauren Douglas is a 25 y21.o. G1P0000 at 721w4d for routine follow up.  Denies any abdominal pain, cramping, nausea, vomiting. Does have some back pain when trying to sleep and pressure supra-pubically -- but no actual contractions.  Positive fetal movement. No vaginal discharge or bleeding. Continues to have some edema in her ankles when working.  See flow sheet for details.  A/P: Pregnancy at 801w4d.  Doing well.    Infant feeding choice: Breast Contraception choice: Depo GBS/GC/CZ results were reviewed today.   Labor precautions reviewed. Kick counts reviewed. Follow-up in one week.  She will be post-dates next week. BPP scheduled for latter half of next week if still pregnant.

## 2016-10-18 NOTE — Patient Instructions (Signed)
It was very good to see you today!  We are getting you scheduled to see us and to get the stress test for the baby next week if you still haven't delivered.  Best of luck!  If you have any headaches, worsening swelling, not feeling the baby move, loss of fluid, go [facility] immediately.

## 2016-10-21 ENCOUNTER — Ambulatory Visit: Payer: Medicaid Other | Admitting: Family Medicine

## 2016-10-21 ENCOUNTER — Ambulatory Visit (INDEPENDENT_AMBULATORY_CARE_PROVIDER_SITE_OTHER): Payer: Medicaid Other | Admitting: Family Medicine

## 2016-10-21 VITALS — BP 115/60 | HR 98 | Temp 98.1°F | Wt 179.6 lb

## 2016-10-21 DIAGNOSIS — Z3403 Encounter for supervision of normal first pregnancy, third trimester: Secondary | ICD-10-CM

## 2016-10-21 NOTE — Progress Notes (Signed)
Lauren Douglas is a 26 y.o. G1P0000 at 7071w0d for routine follow up.  no abdominal pain, cramping, nausea, vomiting. Positive fetal movement. No vaginal discharge or bleeding. Continues to have some edema in her ankles and feet. Is not working for the last week so she has been able to relax more.  See flow sheet for details.  A/P: Pregnancy at 3171w0d.  Doing well.   Pregnancy issues include round ligament pain  Infant feeding choice breast-feeding Contraception choice: Depo Infant circumcision desired not applicable GBS/GC/CZ results  were reviewed today.   Labor precautions reviewed. Kick counts reviewed. BPP scheduled this week  Induction scheduled for 41+ weeks on  August 13th at 7:30 AM  Lauren Simmondshristina Gambino, MD [facility], PGY-3

## 2016-10-21 NOTE — Patient Instructions (Signed)
Thank you for coming in today, it was so nice to see you! Today we talked about:    You are going great! I have made an appt for you to be [facility] at 7:30AM. You will only need to go to this appointment if you do not go into labor this week.   Come to the MAU (maternity admission unit) for 1) Strong contractions every 2-3 minutes for at least 1 hour that do no go away when you drink water or take a warm shower. These contractions will be so strong all you can do is breath through them 2) Vaginal bleeding- anything more than spotting 3) Loss of fluid like you broke your water  4) Decreased movement of your baby   If you have any questions or concerns, please do not hesitate to call the office at [phone]. You can also message me directly via MyChart.   Sincerely,  Anders Simmondshristina Gambino, MD

## 2016-10-22 ENCOUNTER — Encounter (HOSPITAL_COMMUNITY): Payer: Self-pay

## 2016-10-22 ENCOUNTER — Inpatient Hospital Stay (HOSPITAL_COMMUNITY)
Admission: AD | Admit: 2016-10-22 | Discharge: 2016-10-22 | Disposition: A | Discharge status: Home / Self Care | Payer: Medicaid Other | Source: Ambulatory Visit | Attending: Family Medicine | Admitting: Family Medicine

## 2016-10-22 ENCOUNTER — Inpatient Hospital Stay (HOSPITAL_COMMUNITY)
Admission: AD | Admit: 2016-10-22 | Discharge: 2016-10-25 | DRG: 775 | Disposition: A | Discharge status: Home / Self Care | Payer: Medicaid Other | Source: Ambulatory Visit | Attending: Family Medicine | Admitting: Family Medicine

## 2016-10-22 ENCOUNTER — Encounter (HOSPITAL_COMMUNITY): Payer: Self-pay | Admitting: *Deleted

## 2016-10-22 DIAGNOSIS — Z3A40 40 weeks gestation of pregnancy: Secondary | ICD-10-CM

## 2016-10-22 DIAGNOSIS — O1490 Unspecified pre-eclampsia, unspecified trimester: Secondary | ICD-10-CM | POA: Diagnosis present

## 2016-10-22 DIAGNOSIS — O1404 Mild to moderate pre-eclampsia, complicating childbirth: Principal | ICD-10-CM | POA: Diagnosis present

## 2016-10-22 DIAGNOSIS — O1414 Severe pre-eclampsia complicating childbirth: Secondary | ICD-10-CM | POA: Diagnosis not present

## 2016-10-22 DIAGNOSIS — Z3493 Encounter for supervision of normal pregnancy, unspecified, third trimester: Secondary | ICD-10-CM | POA: Diagnosis present

## 2016-10-22 DIAGNOSIS — O479 False labor, unspecified: Secondary | ICD-10-CM

## 2016-10-22 DIAGNOSIS — O139 Gestational [pregnancy-induced] hypertension without significant proteinuria, unspecified trimester: Secondary | ICD-10-CM

## 2016-10-22 HISTORY — DX: Gestational (pregnancy-induced) hypertension without significant proteinuria, unspecified trimester: O13.9

## 2016-10-22 HISTORY — DX: Major depressive disorder, single episode, unspecified: F32.9

## 2016-10-22 LAB — CBC
HCT: 32.4 % — ABNORMAL LOW (ref 36.0–46.0)
Hemoglobin: 11.1 g/dL — ABNORMAL LOW (ref 12.0–15.0)
MCH: 28.1 pg (ref 26.0–34.0)
MCHC: 34.3 g/dL (ref 30.0–36.0)
MCV: 82 fL (ref 78.0–100.0)
Platelets: 254 10*3/uL (ref 150–400)
RBC: 3.95 MIL/uL (ref 3.87–5.11)
RDW: 14.3 % (ref 11.5–15.5)
WBC: 10.2 10*3/uL (ref 4.0–10.5)

## 2016-10-22 LAB — COMPREHENSIVE METABOLIC PANEL
ALBUMIN: 3.1 g/dL — AB (ref 3.5–5.0)
ALK PHOS: 170 U/L — AB (ref 38–126)
ALT: 13 U/L — AB (ref 14–54)
ANION GAP: 11 (ref 5–15)
AST: 25 U/L (ref 15–41)
BUN: 13 mg/dL (ref 6–20)
CALCIUM: 9.1 mg/dL (ref 8.9–10.3)
CO2: 18 mmol/L — AB (ref 22–32)
Chloride: 107 mmol/L (ref 101–111)
Creatinine, Ser: 0.71 mg/dL (ref 0.44–1.00)
GFR calc Af Amer: >60 mL/min (ref >60)
GFR calc non Af Amer: >60 mL/min (ref >60)
GLUCOSE: 81 mg/dL (ref 65–99)
Potassium: 4.2 mmol/L (ref 3.5–5.1)
SODIUM: 136 mmol/L (ref 135–145)
Total Bilirubin: 0.9 mg/dL (ref 0.3–1.2)
Total Protein: 6.8 g/dL (ref 6.5–8.1)

## 2016-10-22 MED ORDER — OXYCODONE-ACETAMINOPHEN 5-325 MG PO TABS: 2.0000 | ORAL_TABLET | ORAL | Status: DC | PRN

## 2016-10-22 MED ORDER — LIDOCAINE HCL (PF) 1 % IJ SOLN
30.0000 mL | INTRAMUSCULAR | Status: DC | PRN
  Filled 2016-10-22: qty 30

## 2016-10-22 MED ORDER — LACTATED RINGERS IV SOLN
INTRAVENOUS | Status: DC
  Administered 2016-10-22: 23:00:00 via INTRAVENOUS

## 2016-10-22 MED ORDER — LACTATED RINGERS IV SOLN: 500.0000 mL | INTRAVENOUS | Status: DC | PRN

## 2016-10-22 MED ORDER — SOD CITRATE-CITRIC ACID 500-334 MG/5ML PO SOLN: 30.0000 mL | ORAL | Status: DC | PRN

## 2016-10-22 MED ORDER — ONDANSETRON HCL 4 MG/2ML IJ SOLN: 4.0000 mg | Freq: Four times a day (QID) | INTRAMUSCULAR | Status: DC | PRN

## 2016-10-22 MED ORDER — OXYTOCIN 40 UNITS IN LACTATED RINGERS INFUSION - SIMPLE MED
2.5000 [IU]/h | INTRAVENOUS | Status: DC
  Filled 2016-10-22: qty 1000

## 2016-10-22 MED ORDER — OXYTOCIN BOLUS FROM INFUSION
500.0000 mL | Freq: Once | INTRAVENOUS | Status: AC
  Administered 2016-10-23: 500 mL via INTRAVENOUS

## 2016-10-22 MED ORDER — ACETAMINOPHEN 325 MG PO TABS: 650.0000 mg | ORAL_TABLET | ORAL | Status: DC | PRN

## 2016-10-22 MED ORDER — OXYCODONE-ACETAMINOPHEN 5-325 MG PO TABS: 1.0000 | ORAL_TABLET | ORAL | Status: DC | PRN

## 2016-10-22 NOTE — Discharge Instructions (Signed)

## 2016-10-22 NOTE — MAU Note (Signed)
+  contractions 5 minutes apart  Denies LOF. +Bloody show; yellow mucous like  +FM

## 2016-10-22 NOTE — MAU Note (Signed)
I have communicated with Dr. Talbert ForestShirley and reviewed vital signs:  Vitals:   10/22/16 1205 10/22/16 1649  BP: 136/83 139/86  Pulse: 83   Resp: 20   Temp: 97.7 F (36.5 C)     Vaginal exam:  Dilation: 4 Effacement (%): 90 Cervical Position: Middle Station: -2 Presentation: Vertex Exam by:: Ashten Sarnowski rn,   Also reviewed contraction pattern and that non-stress test is reactive.  It has been documented that patient is contracting every 5-6 minutes with minimal cervical change over 4.5 hours not indicating active labor.  Patient denies any other complaints.  Based on this report provider has given order for discharge.  A discharge order and diagnosis entered by a provider.   Labor discharge instructions reviewed with patient.

## 2016-10-23 ENCOUNTER — Ambulatory Visit (HOSPITAL_COMMUNITY): Admission: RE | Admit: 2016-10-23 | Payer: Medicaid Other | Source: Ambulatory Visit

## 2016-10-23 ENCOUNTER — Encounter (HOSPITAL_COMMUNITY): Payer: Self-pay | Admitting: Family Medicine

## 2016-10-23 DIAGNOSIS — Z3A40 40 weeks gestation of pregnancy: Secondary | ICD-10-CM

## 2016-10-23 DIAGNOSIS — O1414 Severe pre-eclampsia complicating childbirth: Secondary | ICD-10-CM

## 2016-10-23 LAB — CBC
HCT: 32 % — ABNORMAL LOW (ref 36.0–46.0)
HEMOGLOBIN: 11 g/dL — AB (ref 12.0–15.0)
MCH: 28.6 pg (ref 26.0–34.0)
MCHC: 34.4 g/dL (ref 30.0–36.0)
MCV: 83.1 fL (ref 78.0–100.0)
PLATELETS: 242 10*3/uL (ref 150–400)
RBC: 3.85 MIL/uL — ABNORMAL LOW (ref 3.87–5.11)
RDW: 14.6 % (ref 11.5–15.5)
WBC: 12.7 10*3/uL — ABNORMAL HIGH (ref 4.0–10.5)

## 2016-10-23 LAB — TYPE AND SCREEN
ABO/RH(D): B POS
Antibody Screen: NEGATIVE

## 2016-10-23 LAB — PROTEIN / CREATININE RATIO, URINE
Creatinine, Urine: 79 mg/dL
PROTEIN CREATININE RATIO: 1.73 mg/mg{creat} — AB (ref 0.00–0.15)
Total Protein, Urine: 137 mg/dL

## 2016-10-23 LAB — ABO/RH: ABO/RH(D): B POS

## 2016-10-23 LAB — RPR: RPR Ser Ql: NONREACTIVE

## 2016-10-23 MED ORDER — FENTANYL 2.5 MCG/ML BUPIVACAINE 1/10 % EPIDURAL INFUSION (WH - ANES)
INTRAMUSCULAR | Status: AC
  Filled 2016-10-23: qty 100

## 2016-10-23 MED ORDER — DIPHENHYDRAMINE HCL 25 MG PO CAPS: 25.0000 mg | ORAL_CAPSULE | Freq: Four times a day (QID) | ORAL | Status: DC | PRN

## 2016-10-23 MED ORDER — ACETAMINOPHEN 325 MG PO TABS: 650.0000 mg | ORAL_TABLET | ORAL | Status: DC | PRN

## 2016-10-23 MED ORDER — FENTANYL CITRATE (PF) 100 MCG/2ML IJ SOLN: 100.0000 ug | INTRAMUSCULAR | Status: DC | PRN

## 2016-10-23 MED ORDER — DIBUCAINE 1 % RE OINT: 1.0000 "application " | TOPICAL_OINTMENT | RECTAL | Status: DC | PRN

## 2016-10-23 MED ORDER — PHENYLEPHRINE 40 MCG/ML (10ML) SYRINGE FOR IV PUSH (FOR BLOOD PRESSURE SUPPORT)
PREFILLED_SYRINGE | INTRAVENOUS | Status: AC
  Filled 2016-10-23: qty 20

## 2016-10-23 MED ORDER — BENZOCAINE-MENTHOL 20-0.5 % EX AERO
1.0000 "application " | INHALATION_SPRAY | CUTANEOUS | Status: DC | PRN
  Administered 2016-10-23: 1 via TOPICAL
  Filled 2016-10-23: qty 56

## 2016-10-23 MED ORDER — FENTANYL CITRATE (PF) 100 MCG/2ML IJ SOLN
INTRAMUSCULAR | Status: AC
  Administered 2016-10-23: 100 ug
  Filled 2016-10-23: qty 2

## 2016-10-23 MED ORDER — EPHEDRINE 5 MG/ML INJ
10.0000 mg | INTRAVENOUS | Status: DC | PRN
  Filled 2016-10-23: qty 2

## 2016-10-23 MED ORDER — ONDANSETRON HCL 4 MG/2ML IJ SOLN: 4.0000 mg | INTRAMUSCULAR | Status: DC | PRN

## 2016-10-23 MED ORDER — WITCH HAZEL-GLYCERIN EX PADS: 1.0000 "application " | MEDICATED_PAD | CUTANEOUS | Status: DC | PRN

## 2016-10-23 MED ORDER — DIPHENHYDRAMINE HCL 50 MG/ML IJ SOLN: 12.5000 mg | INTRAMUSCULAR | Status: DC | PRN

## 2016-10-23 MED ORDER — ZOLPIDEM TARTRATE 5 MG PO TABS
5.0000 mg | ORAL_TABLET | Freq: Every evening | ORAL | Status: DC | PRN
Start: 2016-10-23 — End: 2016-10-25

## 2016-10-23 MED ORDER — TERBUTALINE SULFATE 1 MG/ML IJ SOLN
0.2500 mg | Freq: Once | INTRAMUSCULAR | Status: DC | PRN
  Filled 2016-10-23: qty 1

## 2016-10-23 MED ORDER — SENNOSIDES-DOCUSATE SODIUM 8.6-50 MG PO TABS
2.0000 | ORAL_TABLET | ORAL | Status: DC
  Administered 2016-10-23 – 2016-10-25 (×2): 2 via ORAL
  Filled 2016-10-23 (×2): qty 2

## 2016-10-23 MED ORDER — PHENYLEPHRINE 40 MCG/ML (10ML) SYRINGE FOR IV PUSH (FOR BLOOD PRESSURE SUPPORT)
80.0000 ug | PREFILLED_SYRINGE | INTRAVENOUS | Status: DC | PRN
  Filled 2016-10-23: qty 5

## 2016-10-23 MED ORDER — SIMETHICONE 80 MG PO CHEW
80.0000 mg | CHEWABLE_TABLET | ORAL | Status: DC | PRN
Start: 2016-10-23 — End: 2016-10-25

## 2016-10-23 MED ORDER — TETANUS-DIPHTH-ACELL PERTUSSIS 5-2.5-18.5 LF-MCG/0.5 IM SUSP: 0.5000 mL | Freq: Once | INTRAMUSCULAR | Status: DC

## 2016-10-23 MED ORDER — FENTANYL 2.5 MCG/ML BUPIVACAINE 1/10 % EPIDURAL INFUSION (WH - ANES)
14.0000 mL/h | INTRAMUSCULAR | Status: DC | PRN
Start: 2016-10-23 — End: 2016-10-23

## 2016-10-23 MED ORDER — ONDANSETRON HCL 4 MG PO TABS
4.0000 mg | ORAL_TABLET | ORAL | Status: DC | PRN
Start: 2016-10-23 — End: 2016-10-25

## 2016-10-23 MED ORDER — IBUPROFEN 600 MG PO TABS
600.0000 mg | ORAL_TABLET | Freq: Four times a day (QID) | ORAL | Status: DC
  Administered 2016-10-23 – 2016-10-25 (×9): 600 mg via ORAL
  Filled 2016-10-23 (×9): qty 1

## 2016-10-23 MED ORDER — LACTATED RINGERS IV SOLN: 500.0000 mL | Freq: Once | INTRAVENOUS | Status: DC

## 2016-10-23 MED ORDER — OXYTOCIN 40 UNITS IN LACTATED RINGERS INFUSION - SIMPLE MED: 1.0000 m[IU]/min | INTRAVENOUS | Status: DC

## 2016-10-23 MED ORDER — COCONUT OIL OIL
1.0000 | TOPICAL_OIL | Status: DC | PRN
Start: 2016-10-23 — End: 2016-10-25

## 2016-10-23 MED ORDER — PRENATAL MULTIVITAMIN CH
1.0000 | ORAL_TABLET | Freq: Every day | ORAL | Status: DC
  Administered 2016-10-23 – 2016-10-24 (×2): 1 via ORAL
  Filled 2016-10-23 (×2): qty 1

## 2016-10-23 NOTE — H&P (Signed)
LABOR AND DELIVERY ADMISSION HISTORY AND PHYSICAL NOTE  Lauren BartersSalmah Douglas is a 26 y.o. female G1P0000 with IUP at 3682w2d by LMP + 13wk U/S who presented to MAU with contractions and noted to have elevated BPs. She denies any headache, blurry vision, or RUQ. Endorses LE swelling.   She reports positive fetal movement. She denies leakage of fluid or vaginal bleeding.   Prenatal History/Complications: [facility] Prenatal Labs  Dating LMP c/w 13wk sono  Blood type: B/POS/-- (12/07 0916) Positive Sickle cell trait  Genetic Screen 1 Screen:    AFP:     Quad:     NIPS: Antibody:NEG (12/07 0916)  Anatomic US  normal female w/ anterior placenta Rubella: 16.60 (12/07 0916)  GTT Early:      164; 3h normal   Third trimester:  RPR: NON REAC (12/07 0916)   Flu vaccine Yes HBsAg: NEGATIVE (12/07 0916)   TDaP vaccine  Yes                                             Rhogam: N/A HIV: NONREACTIVE (12/07 0916)   Baby Food  Breast feeding GBS: Negative(For PCN allergy, check sensitivities)  Contraception  depo Pap: ASCUS with high risk HPV  Circumcision  n/a   [facility]   Support Person  Lauren Douglas FOB     Past Medical History: Past Medical History:  Diagnosis Date  . Depression 03/05/2014  . Gestational hypertension 10/22/2016    Past Surgical History: Past Surgical History:  Procedure Laterality Date  . NO PAST SURGERIES      Obstetrical History: OB History    Gravida Para Term Preterm AB Living   1 0 0 0 0 0   SAB TAB Ectopic Multiple Live Births   0 0 0 0 0      Social History: Social History   Social History  . Marital status: Married    Spouse name: N/A  . Number of children: N/A  . Years of education: N/A   Occupational History  . med tech and CNA    Social History Main Topics  . Smoking status: Never Smoker  . Smokeless tobacco: Never Used  . Alcohol use No  . Drug use: No  . Sexual activity: Not Asked   Other Topics Concern  . None    Social History Narrative   Lives with her husband Lauren teacher(Lauren)     Family History: Family History  Problem Relation Age of Onset  . Diabetes Neg Hx   . Hypertension Neg Hx     Allergies: No Known Allergies  Prescriptions Prior to Admission  Medication Sig Dispense Refill Last Dose  . Prenatal Vit-Fe Fumarate-FA (PRENATAL VITAMIN) 27-0.8 MG TABS Take 1 tablet by mouth daily. 90 tablet 3 10/22/2016 at Unknown time  . acetaminophen (TYLENOL) 500 MG tablet Take 1 tablet (500 mg total) by mouth every 6 (six) hours as needed. 30 tablet 0 Unknown at Unknown time     Review of Systems   All systems reviewed and negative except as stated in HPI  Physical Exam:  Blood pressure (!) 156/94, pulse 85, temperature 98.8 F (37.1 C), temperature source Oral, resp. rate 20, height 5\' 1"  (1.549 m), weight 179 lb (81.2 kg), last menstrual period 01/15/2016, SpO2 99 %. General appearance: alert and cooperative Lungs: Normal WOB, no respiratory distress Heart: regular  rate  Abdomen: soft, non-tender Extremities: Trace LE edema. No tenderness Presentation: cephalic Fetal monitoring: baseline rate 140, moderate variability, + acel, no decel Uterine activity: ctx 2-4 min Dilation: 5 Effacement (%): 90 Station: -1 Exam by:: Lauren Douglas   Prenatal labs: ABO, Rh: --/--/B POS (08/07 2230) Antibody: NEG (08/07 2230) Rubella: !Error! RPR: Non Reactive (05/31 1029)  HBsAg: NEGATIVE (12/07 0916)  HIV: NONREACTIVE (12/07 0916)  GBS: Negative (07/12 0000)  1 hr Glucola: 164; 3-hr normal Genetic screening:  n/a Anatomy US: normal female with anterior placenta  Prenatal Transfer Tool  Maternal Diabetes: No Genetic Screening: n/a Maternal Ultrasounds/Referrals: Normal Fetal Ultrasounds or other Referrals:  None Maternal Substance Abuse:  No Significant Maternal Medications:  None Significant Maternal Lab Results: None  Results for orders placed or performed during the hospital encounter of  10/22/16 (from the past 24 hour(s))  CBC   Collection Time: 10/22/16 10:30 PM  Result Value Ref Range   WBC 10.2 4.0 - 10.5 K/uL   RBC 3.95 3.87 - 5.11 MIL/uL   Hemoglobin 11.1 (L) 12.0 - 15.0 g/dL   HCT 40.9 (L) 81.1 - 91.4 %   MCV 82.0 78.0 - 100.0 fL   MCH 28.1 26.0 - 34.0 pg   MCHC 34.3 30.0 - 36.0 g/dL   RDW 78.2 95.6 - 21.3 %   Platelets 254 150 - 400 K/uL  Comprehensive metabolic panel   Collection Time: 10/22/16 10:30 PM  Result Value Ref Range   Sodium 136 135 - 145 mmol/L   Potassium 4.2 3.5 - 5.1 mmol/L   Chloride 107 101 - 111 mmol/L   CO2 18 (L) 22 - 32 mmol/L   Glucose, Bld 81 65 - 99 mg/dL   BUN 13 6 - 20 mg/dL   Creatinine, Ser 0.86 0.44 - 1.00 mg/dL   Calcium 9.1 8.9 - 57.8 mg/dL   Total Protein 6.8 6.5 - 8.1 g/dL   Albumin 3.1 (L) 3.5 - 5.0 g/dL   AST 25 15 - 41 U/L   ALT 13 (L) 14 - 54 U/L   Alkaline Phosphatase 170 (H) 38 - 126 U/L   Total Bilirubin 0.9 0.3 - 1.2 mg/dL   GFR calc non Af Amer >60 >60 mL/min   GFR calc Af Amer >60 >60 mL/min   Anion gap 11 5 - 15  Type and screen [facility]   Collection Time: 10/22/16 10:30 PM  Result Value Ref Range   ABO/RH(D) B POS    Antibody Screen NEG    Sample Expiration 10/25/2016   Protein / creatinine ratio, urine   Collection Time: 10/22/16 11:55 PM  Result Value Ref Range   Creatinine, Urine 79.00 mg/dL   Total Protein, Urine 137 mg/dL   Protein Creatinine Ratio 1.73 (H) 0.00 - 0.15 mg/mg[Cre]    Assessment: Lauren Douglas is a 26 y.o. G1P0000 at [redacted]w[redacted]d presenting in latent labor, and noted to have elevated BPs. Ruled in for PreE with UPC of 1.73. HELLP labs wnl  #Labor: Expectant management for now; having regular contractions and making cervical change. Will augment if needed #Pain: Pt planning on natural birth #FWB: Cat I #ID:  GBS neg #MOF: breast #MOC: Depo #Circ:  N/a (girl)  Lauren Douglas 10/23/2016, 1:47 AM

## 2016-10-23 NOTE — Progress Notes (Signed)
Patient feeling a lot of pressure.  SVE: 10/100/0 AROM, clear fluid Will start pushing.  Raynelle FanningJulie P. Jsaon Yoo, MD OB Fellow

## 2016-10-23 NOTE — Progress Notes (Signed)
Lauren Douglas is a 26 y.o. G1P0000 at 5528w2d  admitted for latent labor with elevated Bps.   Subjective: Patient reports being in lot of pain, she is only using fentanyl for pain control with limited success. .   Objective: BP 139/80   Pulse 71   Temp 98.4 F (36.9 C) (Oral)   Resp 20   Ht 5\' 1"  (1.549 m)   Wt 81.2 kg (179 lb)   LMP 01/15/2016 (Exact Date)   SpO2 99%   BMI 33.82 kg/m  No intake/output data recorded. No intake/output data recorded.  FHT:  FHR: 130 bpm, variability: moderate,  accelerations:  Present,  decelerations:  Absent UC:   regular, every 1-3 minutes SVE:   Dilation: 8 Effacement (%): 100 Station: 0 Exam by:: amwalker,rn  Labs: Lab Results  Component Value Date   WBC 10.2 10/22/2016   HGB 11.1 (L) 10/22/2016   HCT 32.4 (L) 10/22/2016   MCV 82.0 10/22/2016   PLT 254 10/22/2016    Assessment / Plan: Spontaneous labor, progressing normally  Labor: Progressing normally Preeclampsia:  labs stable Fetal Wellbeing:  Category I Pain Control:  IV pain meds I/D:  n/a Anticipated MOD:  NSVD  Yannis Gumbs C Jennie Hannay 10/23/2016, 4:47 AM

## 2016-10-24 MED ORDER — IBUPROFEN 600 MG PO TABS: 600.0000 mg | ORAL_TABLET | Freq: Four times a day (QID) | ORAL | 0 refills | Status: DC

## 2016-10-24 NOTE — Discharge Instructions (Signed)

## 2016-10-24 NOTE — Lactation Note (Signed)
This note was copied from a baby's chart. Lactation Consultation Note New mom states baby is fussy. Discussed cluster feeding. Baby is very congested. Snorting frequently.  Encouraged football position w/t nasal congestion. Baby latches, gagging frequently, unable to maintain latch d/t snorting and having difficulty breathing w/mouth full of breast. Frequently popping off.  Mom has large breast w/everted nipples. Hand expressed 15 ml colostrum. W/gloved finger and curve tipped syring, attempted to finger feed. Baby took some colostrum, at intervals gagging and spitting colostrum out. Called RN into see baby for nasal drops. Mom concerned d/t snorting.  Hand expression taught and demonstrated. Mom not able to hand express well. Encouraged mom to cont. To try. Mom wanted DEBP set up. Mom shown how to use DEBP & how to disassemble, clean, & reassemble parts. Mom knows to pump q3h for 15-20 min.  Mom encouraged to feed baby 8-12 times/24 hours and with feeding cues. Mom encouraged to waken baby for feeds.  answered lots of moms questions.  [facility] brochure given w/resources, support groups and LC services. Mom has WIC.   Patient Name: [name] ZOXWR'UToday's Date: 10/24/2016 Reason for consult: Initial assessment;1st time breastfeeding   Maternal Data Has patient been taught Hand Expression?: Yes Does the patient have breastfeeding experience prior to this delivery?: No  Feeding Feeding Type: Breast Milk Length of feed: 5 min  LATCH Score Latch: Repeated attempts needed to sustain latch, nipple held in mouth throughout feeding, stimulation needed to elicit sucking reflex.  Audible Swallowing: A few with stimulation  Type of Nipple: Everted at rest and after stimulation  Comfort (Breast/Nipple): Soft / non-tender  Hold (Positioning): Full assist, staff holds infant at breast  LATCH Score: 6  Interventions Interventions: Breast feeding basics reviewed;Support pillows;Assisted with  latch;Position options;Skin to skin;Expressed milk;Breast massage;Hand express;Breast compression;Adjust position;DEBP;Hand pump  Lactation Tools Discussed/Used Tools: Pump Breast pump type: Double-Electric Breast Pump;Manual WIC Program: Yes Pump Review: Setup, frequency, and cleaning;Milk Storage Initiated by:: Peri JeffersonL. Siarra Gilkerson RN IBCLC Date initiated:: 10/24/16   Consult Status Consult Status: Follow-up Date: 10/24/16 Follow-up type: In-patient    Lauren DancerCARVER, [name] G 10/24/2016, 6:17 AM

## 2016-10-24 NOTE — Progress Notes (Addendum)
Post Partum Day 1 Subjective: up ad lib, voiding, tolerating PO and + flatus Patient has pain but managed with tylenol/ibuprofen and infrequently needs opioids for severe pain. Patient reports that she is ready to go home.   Objective: Blood pressure 134/68, pulse 88, temperature 98 F (36.7 C), temperature source Oral, resp. rate 18, height 5\' 1"  (1.549 m), weight 81.2 kg (179 lb), last menstrual period 01/15/2016, SpO2 100 %, unknown if currently breastfeeding.  Physical Exam:  General: alert, cooperative and no distress Lochia: appropriate Uterine Fundus: firm Incision: healing well, no significant drainage, no significant erythema DVT Evaluation: No evidence of DVT seen on physical exam.   Recent Labs  10/22/16 2230 10/23/16 0618  HGB 11.1* 11.0*  HCT 32.4* 32.0*    Assessment/Plan: Social Work consult - nurse reported concern for postpartum depression and submitted a social work consult. Pending consult's assessment will direct patient's discharge. Patient ready to go home from a medical standpoint.   LOS: 2 days   Lauren Douglas 10/24/2016, 7:43 AM   [name] attestation Post Partum Day #1 I have seen and examined this patient and agree with above documentation in the resident's note.   Lauren BartersSalmah Douglas is a 26 y.o. G1P1001 s/p SVD.  Pt denies problems with ambulating, voiding or po intake. Pain is well controlled.  Plan for birth control is Depo-Provera.  Method of Feeding: breast  PE:  BP 134/68 (BP Location: Right Arm)   Pulse 88   Temp 98 F (36.7 C) (Oral)   Resp 18   Ht 5\' 1"  (1.549 m)   Wt 81.2 kg (179 lb)   LMP 01/15/2016 (Exact Date)   SpO2 100%   Breastfeeding? Unknown   BMI 33.82 kg/m  Fundus firm  Plan for discharge: possibly today after SW consult; if not then 10/25/16  Cam HaiSHAW, [name], [name] 9:06 AM 10/24/2016

## 2016-10-24 NOTE — Progress Notes (Signed)
CSW received consult due to score greater than 9, or positive for SI on Edinburgh Depression Screen.    When CSW arrived, MOB was resting in bed and infant was asleep in bassinet.  MOB was polite, forthcoming, and receptive to meeting with CSW.  CSW explained CSW's role and encouraged MOB to ask questions. CSW acknowledged edinburgh score results and communicated that overall "I feel good".  However, MOB explained that MOB's last week of pregnancy was stressful.  MOB denied SI and HI.  MOB did not present with any acute MH signs and symptoms and appeared to have insight and awareness.   CSW provided education regarding Baby Blues vs PMADs and provided MOB with information about support groups held at [facility].  CSW encouraged MOB to evaluate her mental health throughout the postpartum period with the use of the New Mom Checklist developed by [facility]partum [facility] and notify a medical professional if symptoms arise.    CSW also provided MOB with information to enroll infant on MOB's Food Stamps, WIC, and Medicaid application.  MOB expressed being prepared to parent and having all necessary items for infant. CSW provided SIDS education and MOB responded appropriately to CSW's questions and asked appropriate questions.   There are no barriers to d/c.  Vinay Ertl Boyd-Gilyard, MSW, LCSW Clinical Social Work [phone] 

## 2016-10-24 NOTE — Lactation Note (Signed)
This note was copied from a baby's chart. Lactation Consultation Note  Patient Name: Lauren Douglas WGNFA'OToday's Date: 10/24/2016 Reason for consult: Follow-up assessment Baby at 31 hr of life. Mom called for lactation. Upon arrival mom was eating lunch and requested lactation come back later. She will call at the next feeding.   Maternal Data    Feeding Feeding Type: Breast Fed  LATCH Score Latch: Grasps breast easily, tongue down, lips flanged, rhythmical sucking.  Audible Swallowing: Spontaneous and intermittent  Type of Nipple: Everted at rest and after stimulation  Comfort (Breast/Nipple): Soft / non-tender  Hold (Positioning): No assistance needed to correctly position infant at breast.  LATCH Score: 10  Interventions    Lactation Tools Discussed/Used     Consult Status Consult Status: Follow-up Date: 10/24/16 Follow-up type: In-patient    Lauren Douglas 10/24/2016, 1:42 PM

## 2016-10-24 NOTE — Discharge Summary (Signed)
OB Discharge Summary     Patient Name: Lauren Douglas DOB: [birth year] MRN: [id number]  Date of admission: 10/22/2016 Delivering MD: Frederik Pear   Date of discharge: 10/24/2016  Admitting diagnosis: 40.1wks CTX less than Intrauterine pregnancy: [redacted]w[redacted]d     Secondary diagnosis:  Principal Problem:   SVD (spontaneous vaginal delivery) Active Problems:   Pre-eclampsia    Discharge diagnosis: Term Pregnancy Delivered                                                                                                Post partum procedures:None  Augmentation: AROM  Complications: None  Hospital course:  Onset of Labor With Vaginal Delivery     26 y.o. yo G1P1001 at [redacted]w[redacted]d was admitted in Active Labor on 10/22/2016, and progressed well. Labor augmented with AROM. On admission she was noted to have elevated BPs, and she ruled in for preeclampsia without severe features.  Labor course/delivery details: Membrane Rupture Time/Date: 1:45 AM ,10/23/2016   Intrapartum Procedures: Episiotomy: None [1]                                         Lacerations:  1st degree [2];Periurethral [8];Perineal [11]  Patient had a delivery of a Viable infant. 10/23/2016  Information for the patient's newborn:  K[place], [place] Girl Harleigh [[id number]]  Delivery Method: Vag-Spont    Pateint had an uncomplicated postpartum course.  She is ambulating, tolerating a regular diet, passing flatus, and urinating well. Her blood pressures remained <140/90 postpartum. Patient is discharged home in stable condition on 10/24/16.   Physical exam  Vitals:   10/23/16 0940 10/23/16 1400 10/23/16 1742 10/24/16 0558  BP: 130/70 129/67 133/67 134/68  Pulse: 76 81 92 88  Resp: 18 20 18 18   Temp: 98.2 F (36.8 C) 98 F (36.7 C) 98 F (36.7 C) 98 F (36.7 C)  TempSrc: Oral Oral Tympanic Oral  SpO2:    100%  Weight:      Height:       General: alert, cooperative and no distress Lochia: appropriate Uterine Fundus:  firm Incision: Healing well with no significant drainage DVT Evaluation: No evidence of DVT seen on physical exam. Labs: Lab Results  Component Value Date   WBC 12.7 (H) 10/23/2016   HGB 11.0 (L) 10/23/2016   HCT 32.0 (L) 10/23/2016   MCV 83.1 10/23/2016   PLT 242 10/23/2016   CMP Latest Ref Rng & Units 10/22/2016  Glucose 65 - 99 mg/dL 81  BUN 6 - 20 mg/dL 13  Creatinine 7.82 - 9.56 mg/dL 2.13  Sodium 086 - 578 mmol/L 136  Potassium 3.5 - 5.1 mmol/L 4.2  Chloride 101 - 111 mmol/L 107  CO2 22 - 32 mmol/L 18(L)  Calcium 8.9 - 10.3 mg/dL 9.1  Total Protein 6.5 - 8.1 g/dL 6.8  Total Bilirubin 0.3 - 1.2 mg/dL 0.9  Alkaline Phos 38 - 126 U/L 170(H)  AST 15 - 41 U/L 25  ALT 14 - 54 U/L 13(L)  Discharge instruction: per After Visit Summary and "Baby and Me Booklet".  After visit meds:  Allergies as of 10/24/2016   No Known Allergies     Medication List    TAKE these medications   acetaminophen 500 MG tablet Commonly known as:  TYLENOL Take 1 tablet (500 mg total) by mouth every 6 (six) hours as needed.   ibuprofen 600 MG tablet Commonly known as:  ADVIL,MOTRIN Take 1 tablet (600 mg total) by mouth every 6 (six) hours.   Prenatal Vitamin 27-0.8 MG Tabs Take 1 tablet by mouth daily.       Diet: routine diet  Activity: Advance as tolerated. Pelvic rest for 6 weeks.   Outpatient follow up:In two days for BP check then again at 1 week Follow up Appt:No future appointments. Follow up Visit:No Follow-up on file.  Postpartum contraception: Depo Provera  Newborn Data: Live born female  Birth Weight: 6 lb 14.8 oz (3141 g) APGAR: 8, 9  Baby Feeding: Bottle and Breast Disposition:home with mother   10/24/2016 John Giovanniorey P Cox, MD

## 2016-10-24 NOTE — Lactation Note (Signed)
This note was copied from a baby's chart. Lactation Consultation Note  Patient Name: Girl Jess BartersSalmah Kegg ZOXWR'UToday's Date: 10/24/2016 Reason for consult: Follow-up assessment Baby at 32 hr of life. Mom called lactation because she was going to bf the baby. Upon entry mom was finished bf. She had 20ml of expressed milk on the side table. Offered to help feed her milk back to the baby but she reported the milk was from "early this morning". She does not like pumping or manual expression. She desires to only latch baby. Suggested she bf baby for the next 8 hr and see how it goes. If baby does well at the breast she can drop pumping, but if baby does not meet the expected limites mom will add back pumping. Mom was agreeable to the feeding plan.   Maternal Data    Feeding Feeding Type: Breast Fed Length of feed: 15 min  LATCH Score Latch: Grasps breast easily, tongue down, lips flanged, rhythmical sucking.  Audible Swallowing: Spontaneous and intermittent  Type of Nipple: Everted at rest and after stimulation  Comfort (Breast/Nipple): Soft / non-tender  Hold (Positioning): No assistance needed to correctly position infant at breast.  LATCH Score: 10  Interventions    Lactation Tools Discussed/Used     Consult Status Consult Status: Follow-up Date: 10/25/16 Follow-up type: In-patient    Rulon Eisenmengerlizabeth E Annsley Akkerman 10/24/2016, 2:43 PM

## 2016-10-24 NOTE — Lactation Note (Signed)
This note was copied from a baby's chart. Lactation Consultation Note Stressed importance of parents witting down feeding, pees, poops, and spit ups. Gave another feeding chart w/instructions on documenting.  Patient Name: Lauren Douglas ZOXWR'UToday's Date: 10/24/2016 Reason for consult: Initial assessment;1st time breastfeeding   Maternal Data Has patient been taught Hand Expression?: Yes Does the patient have breastfeeding experience prior to this delivery?: No  Feeding Feeding Type: Breast Milk Length of feed: 5 min  LATCH Score Latch: Repeated attempts needed to sustain latch, nipple held in mouth throughout feeding, stimulation needed to elicit sucking reflex.  Audible Swallowing: A few with stimulation  Type of Nipple: Everted at rest and after stimulation  Comfort (Breast/Nipple): Soft / non-tender  Hold (Positioning): Full assist, staff holds infant at breast  LATCH Score: 6  Interventions Interventions: Breast feeding basics reviewed;Support pillows;Assisted with latch;Position options;Skin to skin;Expressed milk;Breast massage;Hand express;Breast compression;Adjust position;DEBP;Hand pump  Lactation Tools Discussed/Used Tools: Pump Breast pump type: Double-Electric Breast Pump;Manual WIC Program: Yes Pump Review: Setup, frequency, and cleaning;Milk Storage Initiated by:: Peri JeffersonL. Lowell Mcgurk RN IBCLC Date initiated:: 10/24/16   Consult Status Consult Status: Follow-up Date: 10/24/16 Follow-up type: In-patient    Anakin Varkey, Diamond NickelLAURA G 10/24/2016, 6:59 AM

## 2016-10-24 NOTE — Lactation Note (Signed)
This note was copied from a baby's chart. Lactation Consultation Note  Patient Name: Lauren Douglas BJYNW'GToday's Date: 10/24/2016 Reason for consult: Follow-up assessment Baby at 30 hr of life. Upon entry baby was having photos taken. Left lactation phone number on the white board for mom to call at the next feeding.   Maternal Data    Feeding Feeding Type: Breast Fed  LATCH Score Latch: Grasps breast easily, tongue down, lips flanged, rhythmical sucking.  Audible Swallowing: Spontaneous and intermittent  Type of Nipple: Everted at rest and after stimulation  Comfort (Breast/Nipple): Soft / non-tender  Hold (Positioning): No assistance needed to correctly position infant at breast.  LATCH Score: 10  Interventions    Lactation Tools Discussed/Used     Consult Status Consult Status: Follow-up Date: 10/24/16 Follow-up type: In-patient    Rulon Eisenmengerlizabeth E Lessie Funderburke 10/24/2016, 12:22 PM

## 2016-10-25 NOTE — Lactation Note (Signed)
This note was copied from a baby's chart. Lactation Consultation Note Mom's breast filling. Over flowing bra. Knots noted to outer aspects of breast. Mom has easily expressed BM. Mom hasn't been pumping. Encouraged yesterday to pump and supplement baby. Mom stated she was going to stand in warm shower to soften breast. Educated on filling, engorgement, prevention, clogged ducts and mastitis. Set up DEBP again, mom pumped 30 ml. Massaged knots. Mom is giving BM as supplement to the baby. Gave mom disposable storage bottles for refrigeration. Discussed milk storage, warming and safety. Mom has good everted nipples, skin intact. Discussed positioning. Encouraged to cont. I&O until return to f/u Dr. Alfonzo BeersAppt. Mom has WIC and [facility].  Mom was given hand pump. Doesn't have DEBP at home. .  Mom states she has good understanding, done verbal teach back. Has LC Out pt. Contacts as well as out side resources. Reported to RN.  Patient Name: Girl Jess BartersSalmah Brant ZOXWR'UToday's Date: 10/25/2016 Reason for consult: Follow-up assessment   Maternal Data    Feeding Feeding Type: Breast Milk Length of feed: 25 min  LATCH Score Latch: Grasps breast easily, tongue down, lips flanged, rhythmical sucking.  Audible Swallowing: Spontaneous and intermittent  Type of Nipple: Everted at rest and after stimulation  Comfort (Breast/Nipple): Filling, red/small blisters or bruises, mild/mod discomfort  Hold (Positioning): Assistance needed to correctly position infant at breast and maintain latch.  LATCH Score: 8  Interventions Interventions: Breast feeding basics reviewed;Support pillows;Assisted with latch;Position options;Expressed milk;Breast massage;Hand express;Pre-pump if needed;Breast compression;Adjust position;DEBP;Hand pump  Lactation Tools Discussed/Used Tools: Pump Breast pump type: Double-Electric Breast Pump   Consult Status Consult Status: Follow-up Date:  10/25/16 Follow-up type: In-patient    Charyl DancerCARVER, Jenni Thew G 10/25/2016, 6:58 AM

## 2016-10-25 NOTE — Lactation Note (Signed)
This note was copied from a baby's chart. Lactation Consultation Note  Patient Name: Lauren Douglas ZOXWR'UToday's Date: 10/25/2016 Reason for consult: Follow-up assessment   Maternal Data    Feeding Feeding Type: Breast Milk Length of feed: 25 min  LATCH Score Latch: Grasps breast easily, tongue down, lips flanged, rhythmical sucking.  Audible Swallowing: Spontaneous and intermittent  Type of Nipple: Everted at rest and after stimulation  Comfort (Breast/Nipple): Filling, red/small blisters or bruises, mild/mod discomfort  Hold (Positioning): Assistance needed to correctly position infant at breast and maintain latch.  LATCH Score: 8  Interventions Interventions: Breast feeding basics reviewed;Support pillows;Assisted with latch;Position options;Expressed milk;Breast massage;Hand express;Pre-pump if needed;Breast compression;Adjust position;DEBP;Hand pump  Lactation Tools Discussed/Used Tools: Pump Breast pump type: Double-Electric Breast Pump   Consult Status Consult Status: Complete Date: 10/25/16    Charyl DancerCARVER, Ishia Tenorio G 10/25/2016, 7:10 AM

## 2016-10-25 NOTE — Discharge Summary (Signed)
OB Discharge Summary     Patient Name: Lauren Douglas DOB: [birth year] MRN: [id number]  Date of admission: 10/22/2016 Delivering MD: Frederik PearEGELE, JULIE P   Date of discharge: 10/25/2016  Admitting diagnosis: 40.1wks CTX less than 5mins Intrauterine pregnancy: 2871w2d     Secondary diagnosis:  Principal Problem:   SVD (spontaneous vaginal delivery) Active Problems:   Pre-eclampsia    Discharge diagnosis: Term Pregnancy Delivered and Preeclampsia (mild)                                                                                                Post partum procedures:None  Augmentation: AROM  Complications: None  Hospital course:  Onset of Labor With Vaginal Delivery     26 y.o. yo G1P1001 at 671w2d was admitted in Active Labor on 10/22/2016, and progressed well. Labor augmented with AROM once she was complete. On admission she was noted to have elevated BPs, and she ruled in for preeclampsia without severe features.  Labor course/delivery details: Membrane Rupture Time/Date: 1:45 AM ,10/23/2016   Intrapartum Procedures: Episiotomy: None [1]                                         Lacerations:  1st degree [2];Periurethral [8];Perineal [11]  Patient had a delivery of a Viable infant. 10/23/2016  Information for the patient's newborn:  Christ Kickamulindwa, Girl Tamantha [[id number]]  Delivery Method: Vag-Spont    Pateint had an uncomplicated postpartum course.  She is ambulating, tolerating a regular diet, passing flatus, and urinating well. Her blood pressures remained <140/90 postpartum. Patient is discharged home in stable condition on 10/25/16.   Physical exam  Vitals:   10/23/16 1742 10/24/16 0558 10/24/16 1900 10/25/16 0611  BP: 133/67 134/68 137/81 123/76  Pulse: 92 88 96 92  Resp: 18 18 18 18   Temp: 98 F (36.7 C) 98 F (36.7 C) 98.7 F (37.1 C) 98.1 F (36.7 C)  TempSrc: Tympanic Oral Oral Oral  SpO2:  100%    Weight:      Height:       General: alert, cooperative and no  distress Lochia: appropriate Uterine Fundus: firm Incision: Healing well with no significant drainage DVT Evaluation: No evidence of DVT seen on physical exam. Labs: Lab Results  Component Value Date   WBC 12.7 (H) 10/23/2016   HGB 11.0 (L) 10/23/2016   HCT 32.0 (L) 10/23/2016   MCV 83.1 10/23/2016   PLT 242 10/23/2016   CMP Latest Ref Rng & Units 10/22/2016  Glucose 65 - 99 mg/dL 81  BUN 6 - 20 mg/dL 13  Creatinine 7.820.44 - 9.561.00 mg/dL 2.130.71  Sodium 086135 - 578145 mmol/L 136  Potassium 3.5 - 5.1 mmol/L 4.2  Chloride 101 - 111 mmol/L 107  CO2 22 - 32 mmol/L 18(L)  Calcium 8.9 - 10.3 mg/dL 9.1  Total Protein 6.5 - 8.1 g/dL 6.8  Total Bilirubin 0.3 - 1.2 mg/dL 0.9  Alkaline Phos 38 - 126 U/L 170(H)  AST 15 - 41 U/L 25  ALT 14 - 54 U/L 13(L)    Discharge instruction: per After Visit Summary and "Baby and Me Booklet".  After visit meds:  Allergies as of 10/25/2016   No Known Allergies     Medication List    TAKE these medications   ibuprofen 600 MG tablet Commonly known as:  ADVIL,MOTRIN Take 1 tablet (600 mg total) by mouth every 6 (six) hours.   Prenatal Vitamin 27-0.8 MG Tabs Take 1 tablet by mouth daily.       Diet: routine diet  Activity: Advance as tolerated. Pelvic rest for 6 weeks.   Outpatient follow up:In two days for BP check then again at 1 week Follow up Appt:No future appointments. Follow up Visit:No Follow-up on file.  Postpartum contraception: Depo Provera  Newborn Data: Live born female  Birth Weight: 6 lb 14.8 oz (3141 g) APGAR: 8, 9  Baby Feeding: Bottle and Breast Disposition:home with mother   10/25/2016 John Giovanni, MD  I have participated in the care of this patient and I agree with the above. Cam Hai [name] 1:15 PM 10/25/2016

## 2016-10-28 ENCOUNTER — Inpatient Hospital Stay (HOSPITAL_COMMUNITY): Admission: RE | Admit: 2016-10-28 | Payer: Medicaid Other | Source: Ambulatory Visit

## 2016-10-28 ENCOUNTER — Ambulatory Visit (INDEPENDENT_AMBULATORY_CARE_PROVIDER_SITE_OTHER): Payer: Medicaid Other | Admitting: *Deleted

## 2016-10-28 VITALS — BP 158/92

## 2016-10-28 DIAGNOSIS — I10 Essential (primary) hypertension: Secondary | ICD-10-CM

## 2016-10-28 NOTE — Progress Notes (Signed)
Patient here today for BP check.  BP today is 158/92 (left arm).  Will inform Dr. Gwendolyn GrantWalden and call patient with advice.  Altamese Dilling~Jeannette Richardson, BSN, RN-BC  Spoke to Dr. Gwendolyn GrantWalden - patient will need office visit to have BP assessed by provider.  Appt scheduled with Dr. Karen ChafeLockamy for 10/29/16 at 10:00 am due to Dr. Gwendolyn GrantWalden is hospital attending this week.  Altamese Dilling~Jeannette Richardson, BSN, RN-BC

## 2016-10-29 ENCOUNTER — Ambulatory Visit (INDEPENDENT_AMBULATORY_CARE_PROVIDER_SITE_OTHER): Payer: Medicaid Other | Admitting: Family Medicine

## 2016-10-29 ENCOUNTER — Encounter: Payer: Self-pay | Admitting: Family Medicine

## 2016-10-29 VITALS — BP 158/98 | HR 86 | Temp 98.5°F | Wt 169.0 lb

## 2016-10-29 DIAGNOSIS — K219 Gastro-esophageal reflux disease without esophagitis: Secondary | ICD-10-CM | POA: Diagnosis not present

## 2016-10-29 DIAGNOSIS — I10 Essential (primary) hypertension: Secondary | ICD-10-CM | POA: Diagnosis not present

## 2016-10-29 DIAGNOSIS — R0602 Shortness of breath: Secondary | ICD-10-CM

## 2016-10-29 MED ORDER — OMEPRAZOLE 20 MG PO CPDR: 20.0000 mg | DELAYED_RELEASE_CAPSULE | Freq: Every day | ORAL | 3 refills | Status: DC

## 2016-10-29 NOTE — Progress Notes (Signed)
Subjective: Chief Complaint  Patient presents with  . Cough     HPI: Lauren Douglas is a 26 y.o. presenting to clinic today to discuss the following:  1 SOB 2 Epigastric pain 3 Hypertension postpartum  Lauren Douglas is here today to discuss here shortness of breath. She states she has difficulty breathing when laying down and needs extra pillows at night. She does not have pain while breathing. She has experienced increased fatigue and sob on exertion.  She is also experiencing epigastric pain that is worse when laying down or on her side. She cannot describe the pain but states it is intermittent lasting and makes her "feel like I have to cough".It does not radiate.  Her BP is still elevated today and on manual recheck was 160/100  She has no cough, no changes in vision, no headaches, increased urination or pain on urination, no cough, congestion, or runny nose. She denies any constipation or diarrhea.  Health Maintenance: none      ROS noted in HPI.   Past Medical, Surgical, Social, and Family History Reviewed & Updated per EMR.   Pertinent Historical Findings include:   History  Smoking Status  . Never Smoker  Smokeless Tobacco  . Never Used      Objective: BP (!) 158/98   Pulse 86   Temp 98.5 F (36.9 C) (Oral)   Wt 169 lb (76.7 kg)   LMP 01/15/2016 (Exact Date)   SpO2 98%   BMI 31.93 kg/m  Vitals and nursing notes reviewed  Physical Exam  Gen: Alert and Oriented x 3, NAD HEENT: Normocephalic, atraumatic, PERRLA, EOMI, TM visible with good light reflex, non-swollen, normal pharyngeal mucosa Neck: trachea midline, no thyroidmegaly, no LAD Resp: CTAB, no wheezing, rales, or rhonchi, comfortable work of breathing CV: RRR, no murmurs, normal S1, S2 split, +2 pulses dorsalis pedis bilaterally Abd: non-distended, mild epigastric tenderness, soft, +bs in all four quadrants, no hepatosplenomegaly MSK: FROM in all four extremities Ext: no clubbing, cyanosis.  +1 pedal edema Psych: appropriate behavior, mood   No results found for this or any previous visit (from the past 72 hour(s)).  Assessment/Plan:  GERD (gastroesophageal reflux disease) Differential diagnosis includes Angina vs Cholangitis vs PNA vs GERD. PNA, Angina, and Cholangitis unlikely due to history and presentation.  Prescribed omeprazole for GERD as patient is still having symptoms consistent with GERD. If no improvement consider further imaging.  Essential hypertension, benign Patient still has elevated BP. Could still be effect of preeclampsia from recent pregnancy vs essential hypertension vs secondary hypertension.  Secondary hypertension unlikely b/c no previous history and no other symptoms or signs on physical exam of underlying disease.  Getting CBC, CMP to check for abnormal kidney and liver function.  Patient will start Nifedipine today for BP control. I did not start HCTZ even though patient has peripheral edema due to the fact that she is breast feeding. Consider changing to HCTZ if she still has peripheral edema at follow up that is not improving.  Shortness of breath PE vs CHF due to recent pregnancy vs PNA vs Atelectasis  PE, PNA, and Atelectasis all unlikely due to history and presentation. Obtain d-dimer today to rule out PE. Obtain CXR in the next few days to check for Atlectasis vs CHF post partum.  Consider getting echo if CXR shows signs of fluid overload or cardiomegaly.    Please see problem based Assessment and Plan PATIENT EDUCATION PROVIDED: See AVS    Diagnosis and plan  along with any newly prescribed medication(s) were discussed in detail with this patient today. The patient verbalized understanding and agreed with the plan. Patient advised if symptoms worsen return to clinic or ER.   Health Maintainance:   No orders of the defined types were placed in this encounter.   No orders of the defined types were placed in this  encounter.    Jules Schick, DO 10/29/2016, 10:29 AM PGY-1, [facility]

## 2016-10-29 NOTE — Patient Instructions (Addendum)
It was great to meet you today! Thank you for letting me participate in your care!  Today, we discussed your blood pressure. It is high again today and we are starting you on two blood pressure medications. Please take Nifideipine as prescribed.  Your chest pain is likely due to Gastroesophageal Reflux Disease (GERD). I have prescribed omeprazole to help with the symptoms.  I also ordered a CXR to look for the cause of your shortness of breath. Please get this done within the next few days. We are also getting some blood work from you today as well.  Please return to the office if your shortness of breath gets worse, becomes more frequency, if you lose consciousness or feel like you might pass out.  Be well, Lauren Schickim Jeson Camacho, DO PGY-1, [facility]

## 2016-10-29 NOTE — Assessment & Plan Note (Addendum)
Differential diagnosis includes Angina vs Cholangitis vs PNA vs GERD. PNA, Angina, and Cholangitis unlikely due to history and presentation.  Prescribed omeprazole for GERD as patient is still having symptoms consistent with GERD. If no improvement consider further imaging.

## 2016-10-29 NOTE — Assessment & Plan Note (Addendum)
Patient still has elevated BP. Could still be effect of preeclampsia from recent pregnancy vs essential hypertension vs secondary hypertension.  Secondary hypertension unlikely b/c no previous history and no other symptoms or signs on physical exam of underlying disease.  Getting CBC, CMP to check for abnormal kidney and liver function.  Patient will start Nifedipine today for BP control. I did not start HCTZ even though patient has peripheral edema due to the fact that she is breast feeding. Consider changing to HCTZ if she still has peripheral edema at follow up that is not improving.

## 2016-10-29 NOTE — Assessment & Plan Note (Addendum)
PE vs CHF due to recent pregnancy vs PNA vs Atelectasis  PE, PNA, and Atelectasis all unlikely due to history and presentation. Obtain d-dimer today to rule out PE. Obtain CXR in the next few days to check for Atlectasis vs CHF post partum.  Consider getting echo if CXR shows signs of fluid overload or cardiomegaly.

## 2016-10-30 ENCOUNTER — Other Ambulatory Visit: Payer: Self-pay | Admitting: Family Medicine

## 2016-10-30 ENCOUNTER — Ambulatory Visit (HOSPITAL_COMMUNITY): Payer: Medicaid Other

## 2016-10-30 ENCOUNTER — Ambulatory Visit (HOSPITAL_COMMUNITY)
Admission: RE | Admit: 2016-10-30 | Discharge: 2016-10-30 | Disposition: A | Discharge status: Home / Self Care | Payer: Medicaid Other | Source: Ambulatory Visit | Attending: Family Medicine | Admitting: Family Medicine

## 2016-10-30 DIAGNOSIS — J90 Pleural effusion, not elsewhere classified: Secondary | ICD-10-CM | POA: Diagnosis not present

## 2016-10-30 DIAGNOSIS — N6459 Other signs and symptoms in breast: Secondary | ICD-10-CM | POA: Insufficient documentation

## 2016-10-30 DIAGNOSIS — R0602 Shortness of breath: Secondary | ICD-10-CM

## 2016-10-30 MED ORDER — IOPAMIDOL (ISOVUE-370) INJECTION 76%
INTRAVENOUS | Status: AC
  Administered 2016-10-30: 73 mL via INTRAVENOUS
  Filled 2016-10-30: qty 100

## 2016-10-30 NOTE — Progress Notes (Signed)
Called and informed patient of results and let her know to get CTA as soon as possible to rule out PE. Patient has SOB at rest that is new and had a positive d-dimer. Getting bilateral venous doppler due to leg swelling and recent pregnancy to rule out DVT.  Informed patient to go immediately to Emergency Room if she begins having increased SOB or difficulty breathing.  Patient expressed understanding and agreement.

## 2016-10-31 ENCOUNTER — Ambulatory Visit (INDEPENDENT_AMBULATORY_CARE_PROVIDER_SITE_OTHER): Payer: Medicaid Other | Admitting: Family Medicine

## 2016-10-31 ENCOUNTER — Ambulatory Visit (HOSPITAL_COMMUNITY): Payer: Medicaid Other

## 2016-10-31 VITALS — BP 125/80 | HR 78 | Temp 98.2°F | Ht 61.0 in | Wt 164.2 lb

## 2016-10-31 DIAGNOSIS — I10 Essential (primary) hypertension: Secondary | ICD-10-CM | POA: Diagnosis present

## 2016-10-31 DIAGNOSIS — R600 Localized edema: Secondary | ICD-10-CM | POA: Diagnosis not present

## 2016-10-31 DIAGNOSIS — R0602 Shortness of breath: Secondary | ICD-10-CM | POA: Diagnosis not present

## 2016-10-31 LAB — CMP AND LIVER
ALT: 67 IU/L — AB (ref 0–32)
AST: 59 IU/L — AB (ref 0–40)
Albumin: 3.5 g/dL (ref 3.5–5.5)
Alkaline Phosphatase: 132 IU/L — ABNORMAL HIGH (ref 39–117)
BILIRUBIN, DIRECT: 0.09 mg/dL (ref 0.00–0.40)
BUN: 13 mg/dL (ref 6–20)
Bilirubin Total: 0.3 mg/dL (ref 0.0–1.2)
CALCIUM: 8.7 mg/dL (ref 8.7–10.2)
CO2: 20 mmol/L (ref 20–29)
Chloride: 107 mmol/L — ABNORMAL HIGH (ref 96–106)
Creatinine, Ser: 1.02 mg/dL — ABNORMAL HIGH (ref 0.57–1.00)
GFR, EST AFRICAN AMERICAN: 88 mL/min/{1.73_m2} (ref >59)
GFR, EST NON AFRICAN AMERICAN: 77 mL/min/{1.73_m2} (ref >59)
GLUCOSE: 70 mg/dL (ref 65–99)
Potassium: 5 mmol/L (ref 3.5–5.2)
Sodium: 141 mmol/L (ref 134–144)
Total Protein: 6.4 g/dL (ref 6.0–8.5)

## 2016-10-31 LAB — CBC WITH DIFFERENTIAL/PLATELET
BASOS ABS: 0 10*3/uL (ref 0.0–0.2)
Basos: 0 %
EOS (ABSOLUTE): 0.2 10*3/uL (ref 0.0–0.4)
Eos: 3 %
HEMOGLOBIN: 11.2 g/dL (ref 11.1–15.9)
Hematocrit: 33.6 % — ABNORMAL LOW (ref 34.0–46.6)
IMMATURE GRANS (ABS): 0 10*3/uL (ref 0.0–0.1)
IMMATURE GRANULOCYTES: 0 %
LYMPHS: 28 %
Lymphocytes Absolute: 1.8 10*3/uL (ref 0.7–3.1)
MCH: 27.9 pg (ref 26.6–33.0)
MCHC: 33.3 g/dL (ref 31.5–35.7)
MCV: 84 fL (ref 79–97)
MONOCYTES: 6 %
Monocytes Absolute: 0.4 10*3/uL (ref 0.1–0.9)
NEUTROS ABS: 4 10*3/uL (ref 1.4–7.0)
NEUTROS PCT: 63 %
PLATELETS: 324 10*3/uL (ref 150–379)
RBC: 4.02 x10E6/uL (ref 3.77–5.28)
RDW: 15.4 % (ref 12.3–15.4)
WBC: 6.4 10*3/uL (ref 3.4–10.8)

## 2016-10-31 LAB — D-DIMER, QUANTITATIVE (NOT AT ARMC): D-DIMER: 18.12 mg{FEU}/L — AB (ref 0.00–0.49)

## 2016-10-31 NOTE — Assessment & Plan Note (Signed)
CT Anigio showed no evidence of PE or PNA. Only very minimal pleural effusion. No symptoms since last visit. No need for further work up at this time as symptoms have resolved and were likely due to recent normal physiologic of pregnancy.

## 2016-10-31 NOTE — Patient Instructions (Addendum)
It was great to see you again! Thank you for letting me participate in your care!  Today, we discussed your recent lab results that ruled out you had a pulmonary embolism as a potential cause of your shortness of breath.   Your blood pressure was well controlled today. We will see you in one month and see if you need to start a medication for you blood pressure. If it is well controlled at the next visit we will not start you on any medication for you blood pressure.  Your bilateral feet swelling is improving. Continue to elevate your feet at night. Please keep your appointment for your venous doppler ultrasound next Tuesday. Continue to watch it from home and if it gets worse or you start having pain, difficulty walking, difficulty breathing please call us immediately.   Be well, Jules Schickim Averiana Clouatre, DO PGY-1, [facility]

## 2016-10-31 NOTE — Assessment & Plan Note (Addendum)
Patient has scheduled Venous doppler U/S LE for 8/21. She was instructed to keep that appointment to rule out DVT.   Improved with leg elevation and rest. Likely due to normal changes related to recent pregnancy. Informed patient to continue to monitor.

## 2016-10-31 NOTE — Progress Notes (Signed)
Subjective: No chief complaint on file.    HPI: Lauren Douglas is a 26 y.o. presenting to clinic today to discuss the following:  1 Blood pressure recheck 2 Shortness of breath follow up 3 Follow up for bilateral lower extremity edema  Lauren Douglas is here today for a follow up for her hypertension postpartum and for her bilateral lower extremity edema.   Her BP is well controlled today and patient states she has felt better since the last visit. She states she did not receive the Nifedipine as it was not at the pharmacy that I intended to prescribe at her previous visit.  Her shortness of breath has resolved and she no longer has any symptoms as of yesterday. I discussed the results of her CT Angiogram with the patient.  Her bilateral LE edema has improved but slight swelling is still in her feet. She continues to rest and elevate her legs when she can.   Health Maintenance: none     ROS noted in HPI.   Past Medical, Surgical, Social, and Family History Reviewed & Updated per EMR.   Pertinent Historical Findings include:   History  Smoking Status  . Never Smoker  Smokeless Tobacco  . Never Used      Objective: BP 125/80 (BP Location: Right Arm, Patient Position: Sitting, Cuff Size: Normal)   Pulse 78   Temp 98.2 F (36.8 C) (Oral)   Ht 5\' 1"  (1.549 m)   Wt 164 lb 3.2 oz (74.5 kg)   LMP 01/15/2016 (Exact Date)   SpO2 98%   BMI 31.03 kg/m  Vitals and nursing notes reviewed  Physical Exam   Gen: Alert and Oriented x 3, NAD HEENT: Normocephalic, atraumatic, PERRLA, EOMI,  non-swollen, normal pharyngeal mucosa Resp: CTAB, no wheezing, rales, or rhonchi, comfortable work of breathing CV: RRR, no murmurs, normal S1, S2 split, +2 pulses dorsalis pedis bilaterally MSK: FROM in all four extremities, no calf tenderness bilaterally Ext: no clubbing, cyanosis, +1 pedal edema Skin: warm, dry, intact, no rashes     Results for orders placed or performed  in visit on 10/29/16 (from the past 72 hour(s))  CMP and Liver     Status: Abnormal   Collection Time: 10/29/16 11:30 AM  Result Value Ref Range   Glucose 70 65 - 99 mg/dL   BUN 13 6 - 20 mg/dL   Creatinine, Ser 7.821.02 (H) 0.57 - 1.00 mg/dL   GFR calc non Af Amer 77 >59 mL/min/1.73   GFR calc Af Amer 88 >59 mL/min/1.73   Sodium 141 134 - 144 mmol/L   Potassium 5.0 3.5 - 5.2 mmol/L   Chloride 107 (H) 96 - 106 mmol/L   CO2 20 20 - 29 mmol/L   Calcium 8.7 8.7 - 10.2 mg/dL   Total Protein 6.4 6.0 - 8.5 g/dL   Albumin 3.5 3.5 - 5.5 g/dL   Bilirubin Total 0.3 0.0 - 1.2 mg/dL   Bilirubin, Direct 9.560.09 0.00 - 0.40 mg/dL   Alkaline Phosphatase 132 (H) 39 - 117 IU/L   AST 59 (H) 0 - 40 IU/L   ALT 67 (H) 0 - 32 IU/L  CBC with Differential     Status: Abnormal   Collection Time: 10/29/16 11:30 AM  Result Value Ref Range   WBC 6.4 3.4 - 10.8 x10E3/uL   RBC 4.02 3.77 - 5.28 x10E6/uL   Hemoglobin 11.2 11.1 - 15.9 g/dL   Hematocrit 21.333.6 (L) 08.634.0 - 46.6 %   MCV 84  79 - 97 fL   MCH 27.9 26.6 - 33.0 pg   MCHC 33.3 31.5 - 35.7 g/dL   RDW 16.1 09.6 - 04.5 %   Platelets 324 150 - 379 x10E3/uL   Neutrophils 63 Not Estab. %   Lymphs 28 Not Estab. %   Monocytes 6 Not Estab. %   Eos 3 Not Estab. %   Basos 0 Not Estab. %   Neutrophils Absolute 4.0 1.4 - 7.0 x10E3/uL   Lymphocytes Absolute 1.8 0.7 - 3.1 x10E3/uL   Monocytes Absolute 0.4 0.1 - 0.9 x10E3/uL   EOS (ABSOLUTE) 0.2 0.0 - 0.4 x10E3/uL   Basophils Absolute 0.0 0.0 - 0.2 x10E3/uL   Immature Granulocytes 0 Not Estab. %   Immature Grans (Abs) 0.0 0.0 - 0.1 x10E3/uL  D-dimer, quantitative (not at [facility])     Status: Abnormal   Collection Time: 10/29/16 11:30 AM  Result Value Ref Range   D-DIMER 18.12 (H) 0.00 - 0.49 mg/L FEU    Comment: According to the assay manufacturer's published package insert, a normal (<0.50 mg/L FEU) D-dimer result in conjunction with a non-high clinical probability assessment, excludes deep vein thrombosis (DVT) and  pulmonary embolism (PE) with high sensitivity. D-dimer values increase with age and this can make VTE exclusion of an older population difficult. To address this, the [employer], based on best available evidence and recent guidelines, recommends that clinicians use age-adjusted D-dimer thresholds in patients greater than 18 years of age with: a) a low probability of PE who do not meet all Pulmonary Embolism Rule Out Criteria, or b) in those with intermediate probability of PE. The formula for an age-adjusted D-dimer cut-off is "age/100". For example, a 26 year old patient would have an age-adjusted cut-off of 0.60 mg/L FEU and an 26 year old 0.80 mg/L FEU.     Assessment/Plan:  Essential hypertension, benign BP well controlled today at 125/80. We will reevaluate need for BP medication at next appointment. If her BP is normal at next visit in 1 month, no need to prescribe BP med. I intended to start her on Nifedipine at her last visit two days ago but it was not sent to the pharmacy.   Since her is well controlled and she has no symptoms I will not start her on anything today.  Recheck CMP at next visit.  Prior to giving birth patient had no history of HTN.   Localized edema Patient has scheduled Venous doppler U/S LE for 8/21. She was instructed to keep that appointment to rule out DVT.   Improved with leg elevation and rest. Likely due to normal changes related to recent pregnancy. Informed patient to continue to monitor.  Shortness of breath CT Anigio showed no evidence of PE or PNA. Only very minimal pleural effusion. No symptoms since last visit. No need for further work up at this time as symptoms have resolved and were likely due to recent normal physiologic of pregnancy.    Please see problem based Assessment and Plan PATIENT EDUCATION PROVIDED: See AVS    Diagnosis and plan along with any newly prescribed medication(s) were discussed in detail with  this patient today. The patient verbalized understanding and agreed with the plan. Patient advised if symptoms worsen return to clinic or ER.   Health Maintainance:   No orders of the defined types were placed in this encounter.   No orders of the defined types were placed in this encounter.    Jules Schick, DO 10/31/2016,  9:27 AM PGY-1, [facility]

## 2016-10-31 NOTE — Assessment & Plan Note (Addendum)
BP well controlled today at 125/80. We will reevaluate need for BP medication at next appointment. If her BP is normal at next visit in 1 month, no need to prescribe BP med. I intended to start her on Nifedipine at her last visit two days ago but it was not sent to the pharmacy.   Since her is well controlled and she has no symptoms I will not start her on anything today.  Recheck CMP at next visit.  Prior to giving birth patient had no history of HTN.

## 2016-11-05 ENCOUNTER — Ambulatory Visit (HOSPITAL_COMMUNITY)
Admission: RE | Admit: 2016-11-05 | Discharge: 2016-11-05 | Disposition: A | Discharge status: Home / Self Care | Payer: Medicaid Other | Source: Ambulatory Visit | Attending: Family Medicine | Admitting: Family Medicine

## 2016-11-05 DIAGNOSIS — R0602 Shortness of breath: Secondary | ICD-10-CM | POA: Insufficient documentation

## 2016-11-05 NOTE — Progress Notes (Addendum)
*  PRELIMINARY RESULTS* Vascular Ultrasound Bilateral lower extremity venous duplex has been completed.  Preliminary findings: No evidence of deep vein thrombosis or baker's cysts bilaterally.  Preliminary results called to office @ 10:25, no answer, left a voicemail.  Chauncey Fischer 11/05/2016, 10:34 AM

## 2016-12-02 ENCOUNTER — Encounter: Payer: Self-pay | Admitting: Family Medicine

## 2016-12-02 ENCOUNTER — Ambulatory Visit (INDEPENDENT_AMBULATORY_CARE_PROVIDER_SITE_OTHER): Payer: Medicaid Other | Admitting: Family Medicine

## 2016-12-02 VITALS — BP 136/72 | HR 88 | Temp 98.2°F | Ht 61.0 in | Wt 153.0 lb

## 2016-12-02 DIAGNOSIS — I10 Essential (primary) hypertension: Secondary | ICD-10-CM

## 2016-12-02 DIAGNOSIS — O1490 Unspecified pre-eclampsia, unspecified trimester: Secondary | ICD-10-CM

## 2016-12-02 LAB — POCT UA - GLUCOSE/PROTEIN
GLUCOSE UA: NEGATIVE
Protein, UA: NEGATIVE

## 2016-12-02 NOTE — Patient Instructions (Signed)
It was very good to see you today.  We can do the Depakote/birth control shot next visit if you would like.  We are checking some blood work today.  Come back to see me in 4-6 weeks.

## 2016-12-02 NOTE — Assessment & Plan Note (Signed)
At goal today. - However with elevated creatinine and transaminases last visit. We are going to recheck a complete metabolic profile today. Also checking a TSH due to fluctuations of BP. -Follow-up 1 month for another blood pressure recheck.

## 2016-12-02 NOTE — Progress Notes (Signed)
Subjective:    Lauren Douglas is a 26 y.o. female who presents to [facility] today for postpartum visit:  1.  Postpartum:  Patient delivered at 40 weeks 2 days a female via normal spontaneous vaginal delivery on 10/22/2016.  She had evidence of preeclampsia on admission.  She had an uncomplicated postpartum course. Blood pressures at discharge were less than 140/90.  Since then she has been doing well. She has had some question of hypertension. Her blood pressures have been variable.  Began developing a headache in the past week. No vision changes or blurry vision. No abdominal pain. No cough. No dyspnea. No urinary symptoms.  No LE edema  She does have trouble with constipation. She's been taking Colace without relief. Starting yesterday she began taking prune juice which helped her have a soft normal bowel movement.  Has fair support system at home. She would like to start back to work in the next month. She has been sleeping well with the baby sleeps. No depressive symptoms. She has no current concerns for her baby.  She has been seen for elevated blood pressures twice since being discharged. She has not been started on a blood pressure medication. She did have elevated LFTs, alkaline phosphatase, creatinine on blood work. We will need repeat blood work today.  Breastfeeding.  Desires Depo-Provera for birth control.  ROS as above per HPI.   The following portions of the patient's history were reviewed and updated as appropriate: allergies, current medications, past medical history, family and social history, and problem list. Patient is a nonsmoker.    PMH reviewed.  Past Medical History:  Diagnosis Date  . Depression 03/05/2014  . Gestational hypertension 10/22/2016   Past Surgical History:  Procedure Laterality Date  . NO PAST SURGERIES      Medications reviewed. Current Outpatient Prescriptions  Medication Sig Dispense Refill  . ibuprofen (ADVIL,MOTRIN) 600 MG tablet Take 1 tablet  (600 mg total) by mouth every 6 (six) hours. 30 tablet 0  . omeprazole (PRILOSEC) 20 MG capsule Take 1 capsule (20 mg total) by mouth daily. 30 capsule 3  . Prenatal Vit-Fe Fumarate-FA (PRENATAL VITAMIN) 27-0.8 MG TABS Take 1 tablet by mouth daily. 90 tablet 3   No current facility-administered medications for this visit.      Objective:   Physical Exam BP 130/82   Pulse 88   Temp 98.2 F (36.8 C) (Oral)   Ht  (1.549 m)   Wt 153 lb (69.4 kg)   SpO2 97%   BMI 28.91 kg/m  Gen:  Alert, cooperative patient who appears stated age in no acute distress.  Vital signs reviewed. HEENT: EOMI,  MMM Cardiac:  Regular rate and rhythm without murmur auscultated.  Good S1/S2. Pulm:  Clear to auscultation bilaterally with good air movement.  No wheezes or rales noted.   Abd:  Soft/nondistended/nontender.  Good bowel sounds throughout all four quadrants.  No masses noted.  Exts: Non edematous BL  LE, warm and well perfused.   No results found for this or any previous visit (from the past 72 hour(s)).

## 2016-12-02 NOTE — Assessment & Plan Note (Signed)
With elevated LFTs last visit. -Headache currently. No abdominal pain. -We are checking CMET and CBC today

## 2016-12-02 NOTE — Assessment & Plan Note (Signed)
See separate section for HTN Otherwise, doing very well.  Has some support at home. Does not feel overwhelmed and has no depressive symptoms from caring for her child. She is still very excited about the birth of her child. -She would like to start Depo-Provera next visit when she returns in a month. Currently she is still breast-feeding. She did have some trouble with constipation but this resolved with prune juice. Not sexually active.

## 2016-12-03 ENCOUNTER — Encounter: Payer: Self-pay | Admitting: Family Medicine

## 2016-12-03 LAB — COMPREHENSIVE METABOLIC PANEL
A/G RATIO: 1.2 (ref 1.2–2.2)
ALBUMIN: 4.2 g/dL (ref 3.5–5.5)
ALT: 20 IU/L (ref 0–32)
AST: 23 IU/L (ref 0–40)
Alkaline Phosphatase: 106 IU/L (ref 39–117)
BILIRUBIN TOTAL: 0.2 mg/dL (ref 0.0–1.2)
BUN / CREAT RATIO: 10 (ref 9–23)
BUN: 10 mg/dL (ref 6–20)
CHLORIDE: 103 mmol/L (ref 96–106)
CO2: 23 mmol/L (ref 20–29)
Calcium: 9.2 mg/dL (ref 8.7–10.2)
Creatinine, Ser: 1.01 mg/dL — ABNORMAL HIGH (ref 0.57–1.00)
GFR calc non Af Amer: 78 mL/min/{1.73_m2} (ref >59)
GFR, EST AFRICAN AMERICAN: 89 mL/min/{1.73_m2} (ref >59)
GLUCOSE: 89 mg/dL (ref 65–99)
Globulin, Total: 3.4 g/dL (ref 1.5–4.5)
POTASSIUM: 4.3 mmol/L (ref 3.5–5.2)
SODIUM: 139 mmol/L (ref 134–144)
TOTAL PROTEIN: 7.6 g/dL (ref 6.0–8.5)

## 2016-12-03 LAB — CBC
HEMOGLOBIN: 13.2 g/dL (ref 11.1–15.9)
Hematocrit: 38.5 % (ref 34.0–46.6)
MCH: 28.1 pg (ref 26.6–33.0)
MCHC: 34.3 g/dL (ref 31.5–35.7)
MCV: 82 fL (ref 79–97)
PLATELETS: 278 10*3/uL (ref 150–379)
RBC: 4.69 x10E6/uL (ref 3.77–5.28)
RDW: 14.7 % (ref 12.3–15.4)
WBC: 5.8 10*3/uL (ref 3.4–10.8)

## 2016-12-03 LAB — TSH: TSH: 1.23 u[IU]/mL (ref 0.450–4.500)

## 2017-01-06 ENCOUNTER — Encounter: Payer: Self-pay | Admitting: Family Medicine

## 2017-01-06 ENCOUNTER — Ambulatory Visit (INDEPENDENT_AMBULATORY_CARE_PROVIDER_SITE_OTHER): Payer: Medicaid Other | Admitting: Family Medicine

## 2017-01-06 VITALS — BP 116/72 | HR 84 | Temp 98.1°F | Ht 61.0 in | Wt 155.6 lb

## 2017-01-06 DIAGNOSIS — Z23 Encounter for immunization: Secondary | ICD-10-CM

## 2017-01-06 DIAGNOSIS — Z30019 Encounter for initial prescription of contraceptives, unspecified: Secondary | ICD-10-CM

## 2017-01-06 LAB — POCT URINE PREGNANCY: Preg Test, Ur: NEGATIVE

## 2017-01-06 MED ORDER — MEDROXYPROGESTERONE ACETATE 150 MG/ML IM SUSY
150.0000 mg | PREFILLED_SYRINGE | Freq: Once | INTRAMUSCULAR | Status: AC
  Administered 2017-01-06: 150 mg via INTRAMUSCULAR

## 2017-01-06 NOTE — Progress Notes (Signed)
Subjective:    Lauren Douglas is a 26 y.o. female who [facility]:  1.  Contraception: Patient has restarted her menses after giving birth.  She has used Depo-Provera previously in the past. She will like to restart this today. She did not have any trouble with this previously. No extremity edema. Her menses was her usual amount of bleeding.  Also desires flu shot today.  ROS as above per HPI.  Pertinently, no chest pain, palpitations, SOB, Fever, Chills, Abd pain, N/V/D.   The following portions of the patient's history were reviewed and updated as appropriate: allergies, current medications, past medical history, family and social history, and problem list. Patient is a nonsmoker.    PMH reviewed.  Past Medical History:  Diagnosis Date  . Depression 03/05/2014  . Gestational hypertension 10/22/2016   Past Surgical History:  Procedure Laterality Date  . NO PAST SURGERIES      Medications reviewed. Current Outpatient Prescriptions  Medication Sig Dispense Refill  . ibuprofen (ADVIL,MOTRIN) 600 MG tablet Take 1 tablet (600 mg total) by mouth every 6 (six) hours. 30 tablet 0  . omeprazole (PRILOSEC) 20 MG capsule Take 1 capsule (20 mg total) by mouth daily. 30 capsule 3  . Prenatal Vit-Fe Fumarate-FA (PRENATAL VITAMIN) 27-0.8 MG TABS Take 1 tablet by mouth daily. 90 tablet 3   No current facility-administered medications for this visit.      Objective:   Physical Exam BP 116/72   Pulse 84   Temp 98.1 F (36.7 C) (Oral)   Ht 5\' 1"  (1.549 m)   Wt 155 lb 9.6 oz (70.6 kg)   LMP 12/31/2016 (Approximate)   SpO2 99%   Breastfeeding? Yes   BMI 29.40 kg/m  Gen:  Alert, cooperative patient who appears stated age in no acute distress.  Vital signs reviewed. Psych:  Happy, conversant.    Impression/plan: 1. Contraception: -Restarting Depo-Provera today. Negative pregnancy test here. -Follow-up in 3 months  - Patient also had several questions  about her new baby. I did answer and discuss these with her as well.

## 2017-02-26 IMAGING — US US OB COMP LESS 14 WK
1 series · 15 of 28 positions shown · non-contrast
Comparison: None.

CLINICAL DATA: No audible Doppler heart tones on exam. Assess
viability. Gestational age by LMP of 13 weeks 2 days.

EXAM:
OBSTETRIC <14 WK ULTRASOUND
TECHNIQUE: Transabdominal ultrasound was performed for evaluation of the
gestation as well as the maternal uterus and adnexal regions.

[Series 1: us ob comp less 14 wk · 34 acquisitions, 15 frames shown]
[im 1/34]
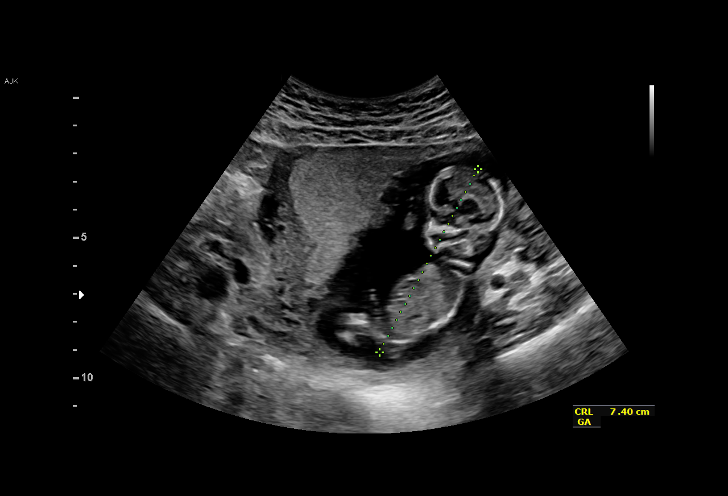
[im 3/34]
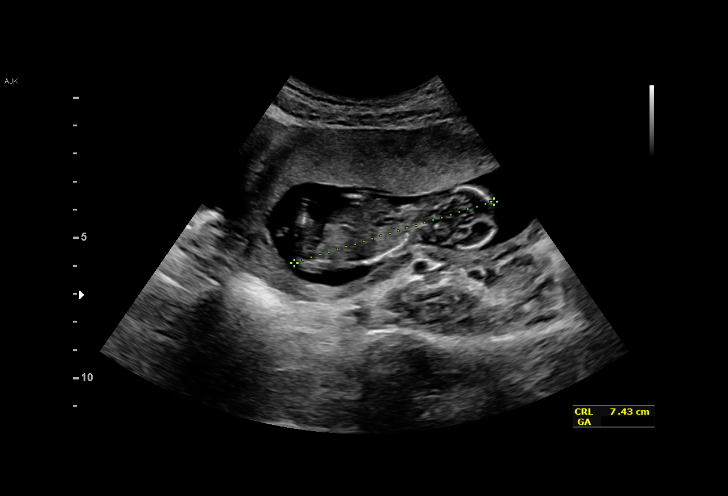
[im 5/34]
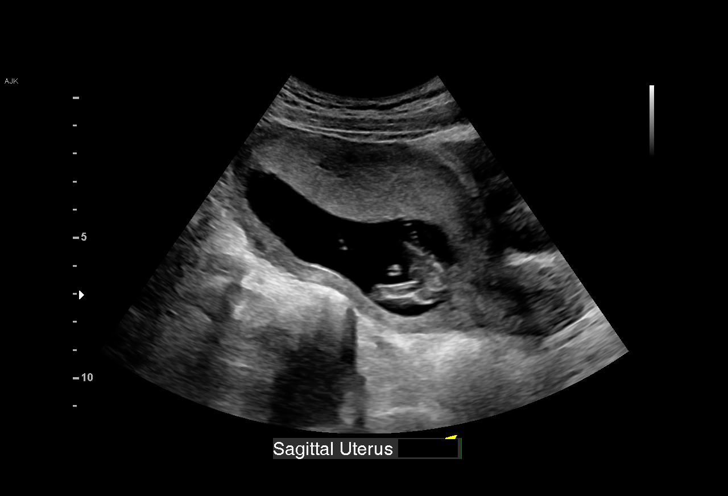
[im 8/34]
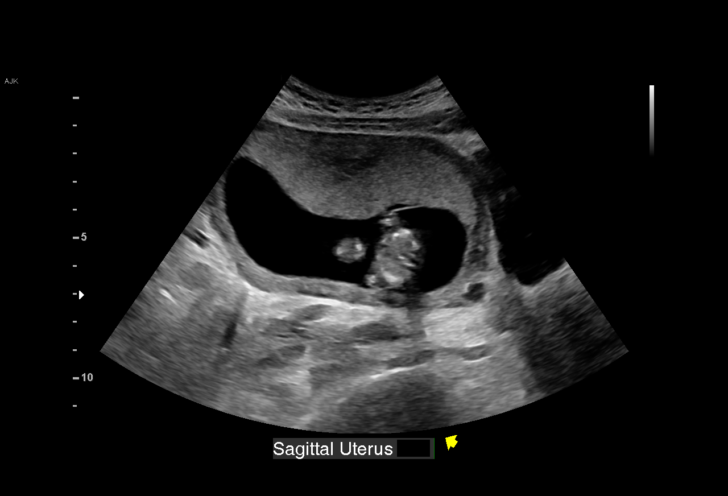
[im 10/34]
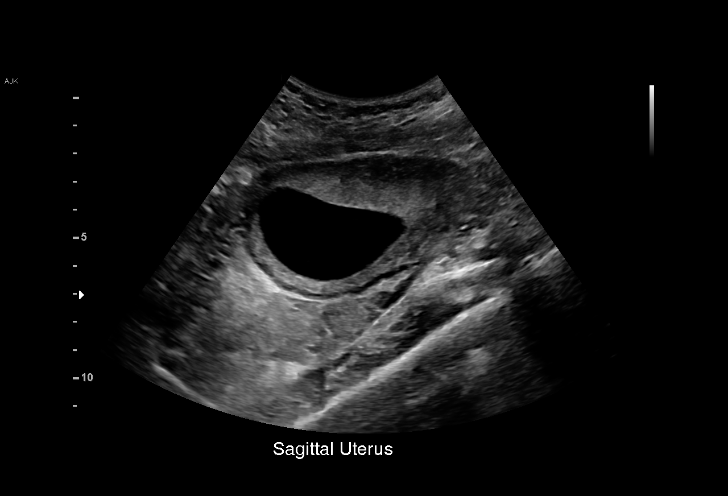
[im 13/34]
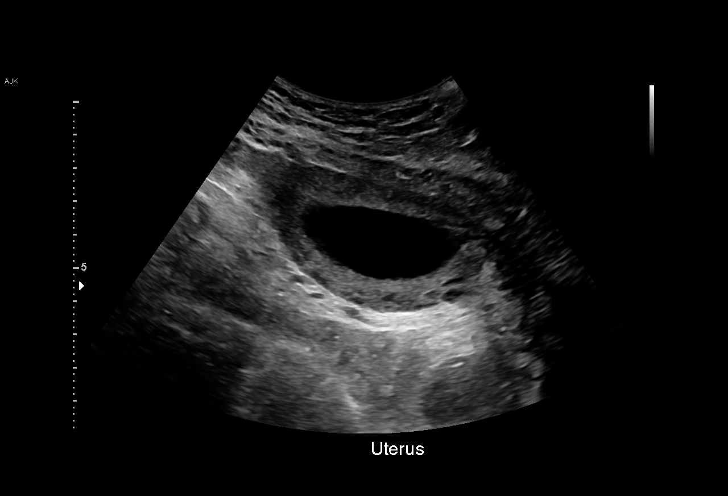
[im 15/34]
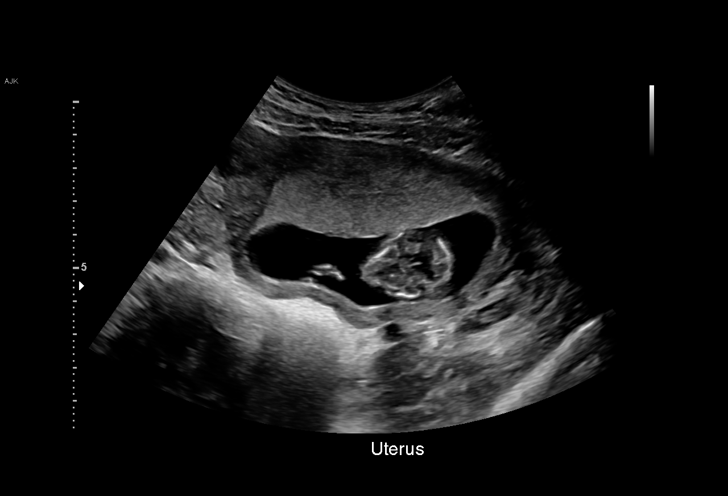
[im 18/34]
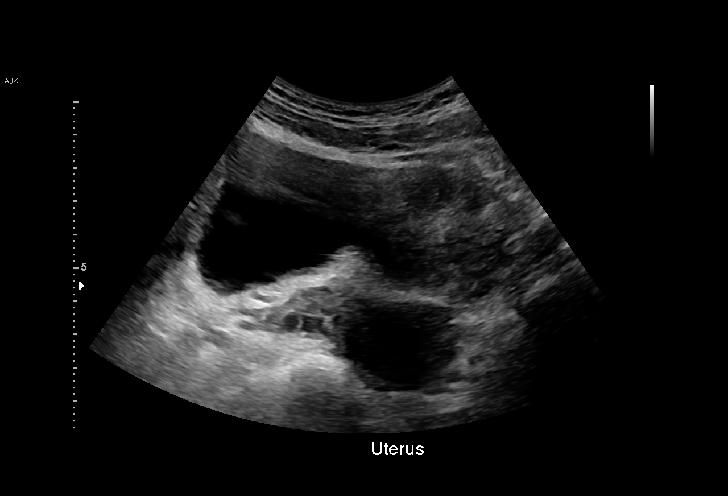
[im 19/34]
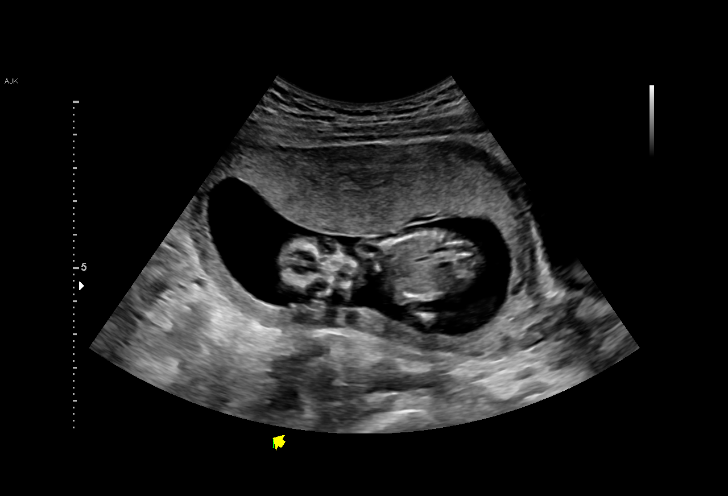
[im 21/34]
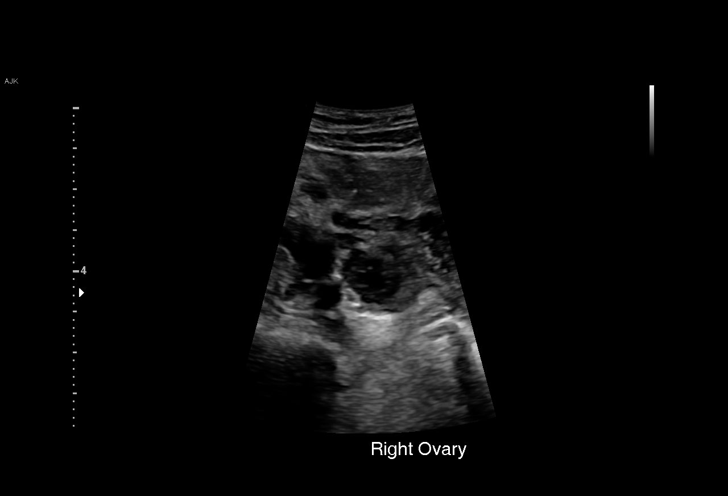
[im 24/34]
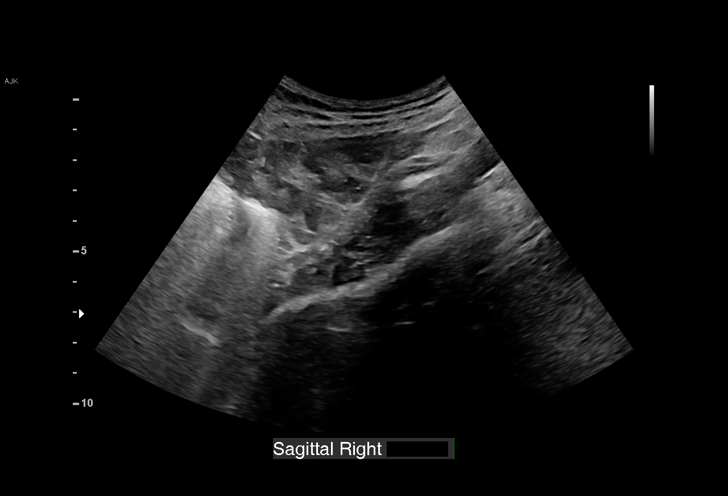
[im 26/34]
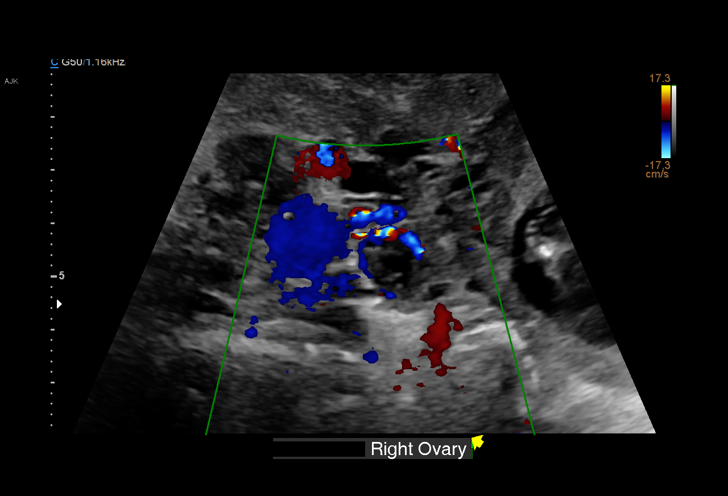
[im 29/34]
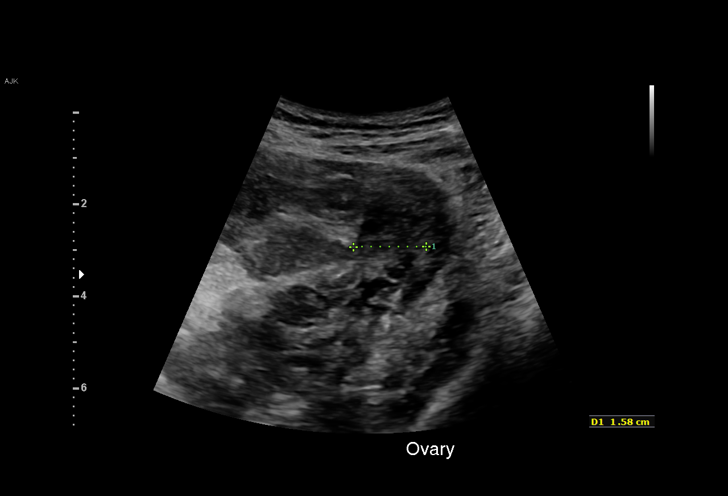
[im 31/34]
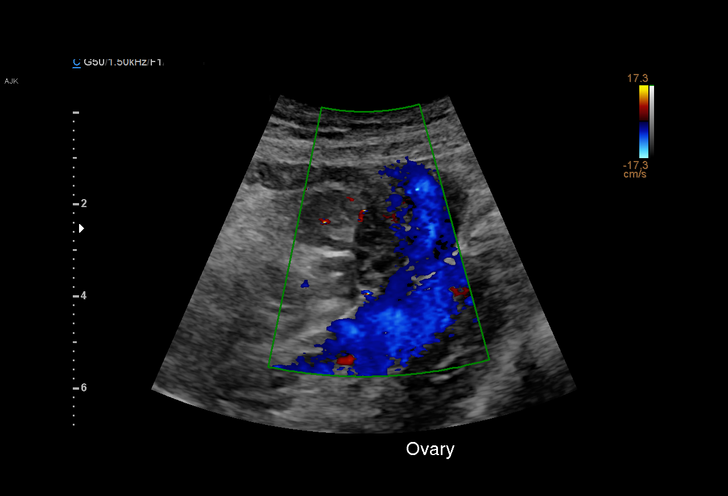
[im 34/34]
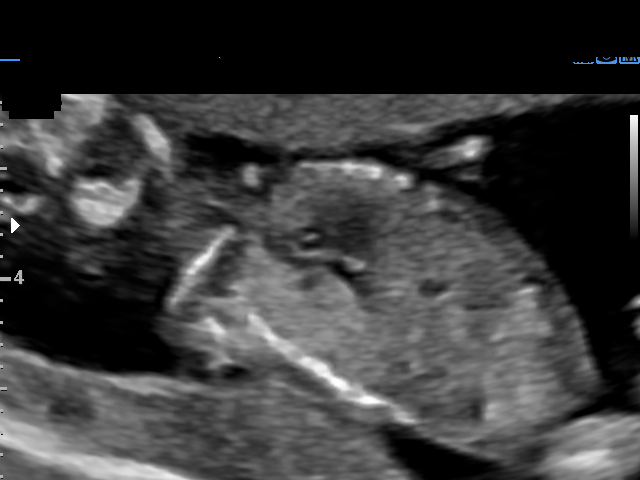

[15 of 28 positions shown; findings below may reference images not displayed]

FINDINGS: Intrauterine gestational sac: Single

Yolk sac:  Not Visualized.

Embryo:  Visualized.

Cardiac Activity: Visualized.

Heart Rate: 154 bpm

CRL:   74  mm   13 w 4 d                  US EDC: 10/19/2016

Subchorionic hemorrhage:  None visualized.

Maternal uterus/adnexae: Normal appearance both ovaries. No mass or
free fluid identified.
IMPRESSION: Single living IUP measuring 13 weeks 4 days, with US EDC of
10/19/2016.

No significant maternal uterine or adnexal abnormality identified.

## 2017-03-28 ENCOUNTER — Ambulatory Visit: Payer: Medicaid Other | Admitting: Family Medicine

## 2017-04-04 ENCOUNTER — Ambulatory Visit (INDEPENDENT_AMBULATORY_CARE_PROVIDER_SITE_OTHER): Payer: Medicaid Other | Admitting: *Deleted

## 2017-04-04 DIAGNOSIS — Z3042 Encounter for surveillance of injectable contraceptive: Secondary | ICD-10-CM

## 2017-04-04 MED ORDER — MEDROXYPROGESTERONE ACETATE 150 MG/ML IM SUSY
150.0000 mg | PREFILLED_SYRINGE | Freq: Once | INTRAMUSCULAR | Status: AC
  Administered 2017-04-04: 150 mg via INTRAMUSCULAR

## 2017-04-04 NOTE — Progress Notes (Signed)
   Patient presents for Depo Provera injection States feeling well Last injection received 01/06/2017  Patient is within dates for injection  Depo Provera given IM LUOQ. Patient tolerated well.  Next injection due April 5-19, 2019 Reminder card given Fredderick SeveranceUCATTE, LAURENZE L, RN

## 2017-05-28 ENCOUNTER — Ambulatory Visit: Payer: Medicaid Other

## 2017-06-25 ENCOUNTER — Telehealth: Payer: Self-pay | Admitting: Family Medicine

## 2017-06-25 NOTE — Telephone Encounter (Signed)
Patient needs a letter stating that [facility]  Please call patient with any questions, thanks.

## 2017-06-27 ENCOUNTER — Ambulatory Visit: Payer: Medicaid Other

## 2017-06-27 NOTE — Telephone Encounter (Signed)
I have completed this letter.  She can either come pick it up or we can mail it, whatever is easier for her.

## 2017-06-27 NOTE — Telephone Encounter (Signed)
Spoke to pt. Will mail letter. Lauren SpillersSharon T Euriah Douglas, CMA

## 2018-09-02 ENCOUNTER — Encounter: Payer: Medicaid Other | Admitting: Family Medicine

## 2018-09-09 ENCOUNTER — Other Ambulatory Visit: Payer: Self-pay

## 2018-09-09 ENCOUNTER — Ambulatory Visit (INDEPENDENT_AMBULATORY_CARE_PROVIDER_SITE_OTHER): Payer: Self-pay | Admitting: Family Medicine

## 2018-09-09 ENCOUNTER — Encounter: Payer: Self-pay | Admitting: Family Medicine

## 2018-09-09 VITALS — BP 110/82 | HR 88 | Wt 154.4 lb

## 2018-09-09 DIAGNOSIS — Z111 Encounter for screening for respiratory tuberculosis: Secondary | ICD-10-CM

## 2018-09-09 DIAGNOSIS — Z020 Encounter for examination for admission to educational institution: Secondary | ICD-10-CM

## 2018-09-09 DIAGNOSIS — R87610 Atypical squamous cells of undetermined significance on cytologic smear of cervix (ASC-US): Secondary | ICD-10-CM

## 2018-09-09 NOTE — Progress Notes (Signed)
   CC: School forms, physical  HPI  She comes with forms for school. 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  Not on any birth control. Stopped in august. Sexually active with men now, women in the past. She is wanting to get pregnant in the future. Sounds like she has maybe been to the health department for STI testing  LMP - 6/10.   ROS: Denies CP, SOB, abdominal pain, dysuria, changes in BMs.   CC, SH/smoking status, and VS noted  Objective: BP 110/82   Pulse 88   Wt 154 lb 6.4 oz (70 kg)   LMP 08/26/2018 (Approximate)   SpO2 99%   BMI 29.17 kg/m  Gen: NAD, alert, cooperative, and pleasant. HEENT: NCAT, EOMI, PERRL CV: RRR, no murmur Resp: CTAB, no wheezes, non-labored Abd: SNTND, BS present, no guarding or organomegaly Ext: No edema, warm Neuro: Alert and oriented, Speech clear, No gross deficits  Assessment and plan:  Abnormal Pap smear of cervix Patient vaguely recalls being told she had abnormal Pap smear results but has not had a follow-up colonoscopy.  She is willing to do colposcopy, we scheduled her appointment for next week.   School physical: She needs rubella titer testing and QuantiFERON gold testing as she has had a history of BCG vaccination.  Filled out forms today.   Orders Placed This Encounter  Procedures  . Rubella screen  . QuantiFERON-TB Gold Plus    No orders of the defined types were placed in this encounter.  Ralene Ok, MD, PGY3 09/10/2018 4:19 PM

## 2018-09-09 NOTE — Patient Instructions (Addendum)
Please take prenatal vitamins every day. This is important to take in the 3 months prior to getting pregnant.   Try to walk for exercise.   Good luck with school!  Please return as schedule for your colposcopy appt.

## 2018-09-10 DIAGNOSIS — R87619 Unspecified abnormal cytological findings in specimens from cervix uteri: Secondary | ICD-10-CM | POA: Insufficient documentation

## 2018-09-10 NOTE — Assessment & Plan Note (Signed)
Patient vaguely recalls being told she had abnormal Pap smear results but has not had a follow-up colonoscopy.  She is willing to do colposcopy, we scheduled her appointment for next week.

## 2018-09-11 LAB — QUANTIFERON-TB GOLD PLUS
QuantiFERON Mitogen Value: 1.54 IU/mL
QuantiFERON Nil Value: 0.01 IU/mL
QuantiFERON TB1 Ag Value: 0.02 IU/mL
QuantiFERON TB2 Ag Value: 0.01 IU/mL
QuantiFERON-TB Gold Plus: NEGATIVE

## 2018-09-11 LAB — RUBELLA SCREEN: Rubella Antibodies, IGG: 14.5 index (ref >0.99)

## 2018-09-14 ENCOUNTER — Encounter: Payer: Self-pay | Admitting: Family Medicine

## 2018-09-24 ENCOUNTER — Ambulatory Visit (INDEPENDENT_AMBULATORY_CARE_PROVIDER_SITE_OTHER): Payer: Self-pay | Admitting: Family Medicine

## 2018-09-24 ENCOUNTER — Other Ambulatory Visit: Payer: Self-pay

## 2018-09-24 ENCOUNTER — Other Ambulatory Visit (HOSPITAL_COMMUNITY)
Admission: RE | Admit: 2018-09-24 | Discharge: 2018-09-24 | Disposition: A | Discharge status: Home / Self Care | Payer: Self-pay | Source: Ambulatory Visit | Attending: Family Medicine | Admitting: Family Medicine

## 2018-09-24 VITALS — BP 110/60 | HR 68 | Temp 98.6°F | Wt 154.0 lb

## 2018-09-24 DIAGNOSIS — Z124 Encounter for screening for malignant neoplasm of cervix: Secondary | ICD-10-CM

## 2018-09-24 DIAGNOSIS — R87610 Atypical squamous cells of undetermined significance on cytologic smear of cervix (ASC-US): Secondary | ICD-10-CM

## 2018-09-24 NOTE — Assessment & Plan Note (Signed)
According to current g\uidelines, would recommend repeat cytology with HPV testing and decide on colposcopy after pap results obtained. Pap obtained today. Will notify of results and followup

## 2018-09-24 NOTE — Patient Instructions (Signed)
I will send you a note about your results.

## 2018-09-24 NOTE — Progress Notes (Signed)
    CHIEF COMPLAINT / HPI: Scheduled for evaluation and management of previous abnormal pap smear. Asymptomatic. G1P1 delivered 10/2016. 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   OBJECTIVE:  WD WN NAD GU externally normal. Cervix normal in apperaacnce, some blood from os.  ASSESSMENT / PLAN:  No problem-specific Assessment & Plan notes found for this encounter.

## 2018-09-29 LAB — CYTOLOGY - PAP
Diagnosis: NEGATIVE
HPV: NOT DETECTED

## 2018-11-10 ENCOUNTER — Telehealth: Payer: Self-pay | Admitting: *Deleted

## 2018-11-10 DIAGNOSIS — Z020 Encounter for examination for admission to educational institution: Secondary | ICD-10-CM

## 2018-11-10 NOTE — Telephone Encounter (Signed)
Sure- does patient or her daughter need titers? Can order for either.  Thanks, Dorris Singh, MD  [facility] Medicine Teaching Service

## 2018-11-10 NOTE — Telephone Encounter (Signed)
School is requiring a varicella titer for pt.  She will be here Friday with her daughter and wonders if she can get the lab drawn at that time.  TO PCP to paced order if appropriate. Christen Bame, CMA

## 2018-11-11 NOTE — Addendum Note (Signed)
Addended by: Owens Shark, CARINA on: 11/11/2018 08:38 AM   Modules accepted: Orders

## 2018-11-11 NOTE — Telephone Encounter (Signed)
Sorry, only mom needs titers.  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 Christen Bame, CMA

## 2018-11-11 NOTE — Telephone Encounter (Signed)
Varicella ordered.  Dorris Singh, MD  Family Medicine Teaching Service

## 2018-11-13 ENCOUNTER — Other Ambulatory Visit: Payer: Self-pay

## 2018-11-14 ENCOUNTER — Encounter: Payer: Self-pay | Admitting: Family Medicine

## 2018-11-14 LAB — VARICELLA ZOSTER ANTIBODY, IGG: Varicella zoster IgG: 1544 index (ref >165)

## 2018-11-19 ENCOUNTER — Encounter: Payer: Self-pay | Admitting: Family Medicine

## 2018-11-26 ENCOUNTER — Telehealth: Payer: Self-pay | Admitting: Family Medicine

## 2018-11-26 NOTE — Telephone Encounter (Signed)
See My Chart Message.

## 2018-11-26 NOTE — Telephone Encounter (Signed)
Nursing---can you please help patient get the results of her varicella titer? See mychart message.  Dorris Singh, MD  [place], Can Please email me My lab results for my chicken box which I did last time because my school needs it? Thanks

## 2018-11-27 ENCOUNTER — Telehealth: Payer: Self-pay

## 2018-11-27 NOTE — Telephone Encounter (Signed)
Called patient and she states that she is able to see results and letter via My Chart.  Lauren Douglas, [place]

## 2018-12-22 ENCOUNTER — Other Ambulatory Visit: Payer: Self-pay

## 2018-12-22 ENCOUNTER — Ambulatory Visit (INDEPENDENT_AMBULATORY_CARE_PROVIDER_SITE_OTHER): Payer: Self-pay

## 2018-12-22 DIAGNOSIS — Z23 Encounter for immunization: Secondary | ICD-10-CM

## 2018-12-22 NOTE — Progress Notes (Signed)
Patient presents in nurse clinic for flu vaccine. Vaccine given, LD. Patient tolerated well.  

## 2019-01-07 ENCOUNTER — Encounter: Payer: Self-pay | Admitting: Family Medicine

## 2019-01-31 ENCOUNTER — Encounter: Payer: Self-pay | Admitting: Family Medicine

## 2019-02-02 ENCOUNTER — Other Ambulatory Visit: Payer: Self-pay

## 2019-02-02 ENCOUNTER — Ambulatory Visit (INDEPENDENT_AMBULATORY_CARE_PROVIDER_SITE_OTHER): Payer: Self-pay | Admitting: Family Medicine

## 2019-02-02 VITALS — BP 110/70 | HR 107 | Temp 98.3°F | Wt 157.6 lb

## 2019-02-02 DIAGNOSIS — O200 Threatened abortion: Secondary | ICD-10-CM

## 2019-02-02 DIAGNOSIS — Z3200 Encounter for pregnancy test, result unknown: Secondary | ICD-10-CM

## 2019-02-02 LAB — POCT URINE PREGNANCY: Preg Test, Ur: POSITIVE — AB

## 2019-02-02 NOTE — Patient Instructions (Addendum)
Dear Lauren Douglas,   It was good to see you! Thank you for taking your time to come in to be seen. Today, we discussed the following:    Today we will be obtaining some blood work that we may need to follow over time.  Additionally, we are scheduling you for an transvaginal ultrasound to confirm that there is no ectopic pregnancy (which would mean that the pregnancy is similar besides her uterus), as this may lead to further complications.  You may have passage of large clots and fetal tissues.  You will likely have cramping with this.  Please call us if you have any questions at all.  We will call you with all of your results when they are available.  Be well,   Zettie Cooley, M.D   [facility] [phone]  *Sign up for MyChart for instant access to your health profile, labs, orders, upcoming appointments or to contact your provider with questions*  ===================================================================================  Threatened Miscarriage  A threatened miscarriage is when you have bleeding from your vagina during the first 20 weeks of pregnancy but the pregnancy does not end. Your doctor will do tests to make sure you are still pregnant. The cause of the bleeding may not be known. This condition does not mean your pregnancy will end, but it does increase the risk that it will end (complete miscarriage). Follow these instructions at home:  Get plenty of rest.  If you have bleeding in your vagina, do not have sex or use tampons.  Do not douche.  Do not smoke or use drugs.  Do not drink alcohol.  Avoid caffeine.  Keep all follow-up prenatal visits as told by your doctor. This is important. Contact a doctor if:  You have light bleeding from your vagina.  You have belly pain or cramping.  You have a fever. Get help right away if:  You have heavy bleeding from your vagina.  You have clots of blood coming from your vagina.  You pass  tissue from your vagina.  You have a gush of fluid from your vagina.  You are leaking fluid from your vagina.  You have very bad pain or cramps in your low back or belly.  You have fever, chills, and very bad belly pain. Summary  A threatened miscarriage is when you have bleeding from your vagina during the first 20 weeks of pregnancy but the pregnancy does not end.  This condition does not mean your pregnancy will end, but it does increase the risk that it will end (complete miscarriage).  Get plenty of rest. If you have bleeding in your vagina, do not have sex or use tampons.  Keep all follow-up prenatal visits as told by your doctor. This is important. This information is not intended to replace advice given to you by your health care provider. Make sure you discuss any questions you have with your health care provider. Document Released: 02/15/2008 Document Revised: 04/10/2017 Document Reviewed: 05/31/2016 Elsevier Patient Education  [place].  Vaginal Bleeding During Pregnancy, First Trimester  A small amount of bleeding (spotting) from the vagina is common during early pregnancy. Sometimes the bleeding is normal and does not cause problems. At other times, though, bleeding may be a sign of something serious. Tell your doctor about any bleeding from your vagina right away. Follow these instructions at home: Activity  Follow your doctor's instructions about how active you can be.  If needed, make plans for someone to help with  your normal activities.  Do not have sex or orgasms until your doctor says that this is safe. General instructions  Take over-the-counter and prescription medicines only as told by your doctor.  Watch your condition for any changes.  Write down: ? The number of pads you use each day. ? How often you change pads. ? How soaked (saturated) your pads are.  Do not use tampons.  Do not douche.  If you pass any tissue from your vagina,  save it to show to your doctor.  Keep all follow-up visits as told by your doctor. This is important. Contact a doctor if:  You have vaginal bleeding at any time while you are pregnant.  You have cramps.  You have a fever. Get help right away if:  You have very bad cramps in your back or belly (abdomen).  You pass large clots or a lot of tissue from your vagina.  Your bleeding gets worse.  You feel light-headed.  You feel weak.  You pass out (faint).  You have chills.  You are leaking fluid from your vagina.  You have a gush of fluid from your vagina. Summary  Sometimes vaginal bleeding during pregnancy is normal and does not cause problems. At other times, bleeding may be a sign of something serious.  Tell your doctor about any bleeding from your vagina right away.  Follow your doctor's instructions about how active you can be. You may need someone to help you with your normal activities. This information is not intended to replace advice given to you by your health care provider. Make sure you discuss any questions you have with your health care provider. Document Released: 07/19/2013 Document Revised: 06/23/2018 Document Reviewed: 06/05/2016 Elsevier Patient Education  2020 [employer].

## 2019-02-02 NOTE — Progress Notes (Signed)
   Subjective  CC: Possible Pregnancy  Lauren Douglas is a 28 y.o. female with PMHx significant for sickle cell trait, who presents today with the following problems:  Positive pregnancy test and bleeding  Patient reports that she had a positive pregnancy test at home within the last few weeks.  She reports her last menstrual cycle was on 12/21/2018.  Her periods are usually every month and last anywhere from 3 to 5 days.  She is coming in as she has been had spotting for the last 2 days which she is concerned about.  Patient denies any cramping or loss of fetal parts.  She denies any abdominal pain.  Pertinent P/F/SHx: Sickle cell trait, G1P1, no known history of ectopic, molar pregnancy  ROS: Pertinent ROS included in HPI. Objective  Physical Exam:  BP 110/70   Pulse (!) 107   Temp 98.3 F (36.8 C) (Axillary)   Wt 157 lb 9.6 oz (71.5 kg)   SpO2 99%   BMI 29.78 kg/m  General: well appearing female  Abdomen: NTND.  Genital: External vulva and vagina nonerythematous, without any obvious lesions or rash. Vaginal canal with moderate blood. Cervix is closed and there does not appear to be any discharge from cervical os. No cervical motion tenderness, mass, or other gross abnormalities. Uterus is retroverted   POCUS:  Transabdominal US: Bladder is decompressed. There is no gestational sac or fetal heart rate identified on ultrasound.     Pertinent Labs:  + UPT  Assessment & Plan    Problem List Items Addressed This Visit      Other   Threatened abortion in early pregnancy    + UPT but physical exam with moderate blood and closed cervix. Patient denies any passage of fetal parts or cramping. No fetal activity appreciated on POCUS; however, difficult given retroverted uterus. Beta HCG quant is 342 (nl 217 - 7138). Patient would be [redacted] weeks pregnant according to her LMP. Would expect hCG levels to platueau ~8-11 weeks and then decline. Patient will need close monitoring  - obtain  TVUS to rule out ectopic pregnancy (Pt may be unable to obtain TVUS due to insurance. She is going to try to apply for medicaid now).  - If unable to get TVUS, return in one week for repeat abdominal US at clinic.  - return precautions provided.   Dicussed with Drs. Marga Melnick        Other Visit Diagnoses    Possible pregnancy    -  Primary   Relevant Orders   POCT urine pregnancy (Completed)   US OB Transvaginal   Beta HCG, Quant (Completed)     Lauren Douglas, M.D.  4:45 PM 02/05/2019

## 2019-02-03 LAB — BETA HCG QUANT (REF LAB): hCG Quant: 342 m[IU]/mL

## 2019-02-05 ENCOUNTER — Encounter: Payer: Self-pay | Admitting: Family Medicine

## 2019-02-05 DIAGNOSIS — O200 Threatened abortion: Secondary | ICD-10-CM | POA: Insufficient documentation

## 2019-02-05 NOTE — Assessment & Plan Note (Addendum)
+   UPT but physical exam with moderate blood and closed cervix. Patient denies any passage of fetal parts or cramping. No fetal activity appreciated on POCUS; however, difficult given retroverted uterus. Beta HCG quant is 342 (nl 217 - 7138). Patient would be [redacted] weeks pregnant according to her LMP. Would expect hCG levels to platueau ~8-11 weeks and then decline. Patient will need close monitoring  - obtain TVUS to rule out ectopic pregnancy (Pt may be unable to obtain TVUS due to insurance. She is going to try to apply for medicaid now).  - If unable to get TVUS, return in one week for repeat abdominal US at clinic.  - return precautions provided.   Dicussed with Drs. Juleen China and [employer]

## 2019-02-08 NOTE — Telephone Encounter (Signed)
Spoke to patient. Has not had any cramping or lost any products of conception. She describes being under a lot of pressure and stressed recently; she thinks this was likely a miscarriage. She does not have any further bleeding and has scheduled her TVUS for next Tuesday. Explained to patient what the next steps are and that we will communicate with her the results of TVUS.  Patient is very appreciated of phone call and expresses feeling more comforted when we can call her and talk to her on the phone.   No further questions from patient.   Wilber Oliphant, M.D.  3:23 PM 02/08/2019

## 2019-02-16 ENCOUNTER — Ambulatory Visit (HOSPITAL_COMMUNITY)
Admission: RE | Admit: 2019-02-16 | Discharge: 2019-02-16 | Disposition: A | Discharge status: Home / Self Care | Payer: Self-pay | Source: Ambulatory Visit | Attending: Family Medicine | Admitting: Family Medicine

## 2019-02-16 ENCOUNTER — Other Ambulatory Visit: Payer: Self-pay

## 2019-02-16 ENCOUNTER — Ambulatory Visit (INDEPENDENT_AMBULATORY_CARE_PROVIDER_SITE_OTHER): Payer: Self-pay | Admitting: General Practice

## 2019-02-16 DIAGNOSIS — O209 Hemorrhage in early pregnancy, unspecified: Secondary | ICD-10-CM

## 2019-02-16 DIAGNOSIS — Z3200 Encounter for pregnancy test, result unknown: Secondary | ICD-10-CM | POA: Insufficient documentation

## 2019-02-16 DIAGNOSIS — Z712 Person consulting for explanation of examination or test findings: Secondary | ICD-10-CM

## 2019-02-16 LAB — BETA HCG QUANT (REF LAB): hCG Quant: <1 m[IU]/mL

## 2019-02-16 NOTE — Progress Notes (Signed)
Patient presents to office today for U/S results. Reviewed with Dr Rosana Hoes including recent hx & visit with PCP. U/S reveals nothing in the uterus or adnexa. Will draw bhcg today.   Discussed with patient. Patient reports bleeding similar to a lighter period 11/15-11/22 and hasn't had bleeding since. Told patient results take approximately 2 hours to come back and I will call her once they are final. Patient verbalized understanding & provided callback number [phone].

## 2019-02-16 NOTE — Progress Notes (Signed)
Results reviewed with Marcille Buffy, [name] .  Provider advises that the pt has miscarried and to f/u with Family Medicine if she has concerns about SAB or wants to start [facility].  Notified pt providers recommendation.  Pt verbalized understanding with no further questions.   Mel Almond, RN 02/16/19

## 2019-03-19 NOTE — L&D Delivery Note (Addendum)
OB/GYN Faculty Practice Delivery Note  Lauren Douglas is a 29 y.o. G3P1011 s/p VD at [redacted]w[redacted]d. She was admitted for SROM/SOL.   ROM: 13h 27m with clear fluid GBS Status: Negative/-- (09/09 1150) Maximum Maternal Temperature: 98.4  Labor Progress: . Initial SVE: 1.5/60/-3. She then progressed to complete.   Delivery Date/Time: 9/28 1912   Delivery: Called to room and patient was complete and pushing. Head delivered ROA. No nuchal cord present. Left compound arm, otherwise body delivered in usual fashion. Infant with spontaneous cry, placed on mother's abdomen, dried and stimulated. Cord clamped x 2 after 1-minute delay, and cut by father. Cord blood drawn. Placenta delivered spontaneously with gentle cord traction. Fundus firm with massage and Pitocin. Labia, perineum, vagina, and cervix inspected inspected with no lacerations.  Baby Weight: pending  Placenta: Sent to L&D Complications: Left compound arm  Lacerations: None EBL: 100 mL Analgesia: None   Infant:  APGAR (1 MIN): 8 APGAR (5 MINS): 9  Leticia Penna, DO Family Medicine PGY-3  12/14/2019, 7:26 PM  GME ATTESTATION:  I saw and evaluated the patient. I agree with the findings and the plan of care as documented in the resident's note.  Alric Seton, MD OB Fellow, Faculty [facility], Center for [facility] 12/14/2019 7:59 PM

## 2019-05-24 ENCOUNTER — Encounter: Payer: Self-pay | Admitting: Family Medicine

## 2019-05-24 ENCOUNTER — Other Ambulatory Visit: Payer: Self-pay

## 2019-05-24 ENCOUNTER — Ambulatory Visit (INDEPENDENT_AMBULATORY_CARE_PROVIDER_SITE_OTHER): Payer: Medicaid Other | Admitting: Family Medicine

## 2019-05-24 VITALS — BP 120/72 | HR 86 | Ht 61.0 in | Wt 166.0 lb

## 2019-05-24 DIAGNOSIS — N926 Irregular menstruation, unspecified: Secondary | ICD-10-CM | POA: Diagnosis not present

## 2019-05-24 LAB — POCT URINE PREGNANCY: Preg Test, Ur: POSITIVE — AB

## 2019-05-24 NOTE — Patient Instructions (Signed)
It was wonderful to see you today.  Thank you for choosing [facility].   Please call [phone] with any questions about today's appointment.  Please be sure to schedule follow up at the front  desk before you leave today.   Terisa Starr, MD  Family Medicine    I will call you with results   Congratulations!  Keep taking your prenatal vitamin

## 2019-05-24 NOTE — Progress Notes (Signed)
Subjective  Lauren Douglas is a 29 y.o. G3 P1011 at 8 weeks 4 days by an LMP that is exact presenting today for positive pregnancy test.  Her last menstrual period was 7 January she reports.  She had a miscarriage in December prior to this pregnancy.  This is a planned pregnancy.  She is taking prenatal vitamins.  No cramping, no bleeding no back pain.  She has breast tenderness and excessive fatigue.  She denies nausea or vomiting  Patient continues to work on weekends.  Her daughter will be 75 years old later this year.  She is excited to have asibling for her daughter.  Objective Vital Signs reviewed BP 120/72   Pulse 86   Ht 5\' 1"  (1.549 m)   Wt 166 lb (75.3 kg)   LMP 09/24/2018   SpO2 98%   BMI 31.37 kg/m   HEENT: Sclera anicteric. Dentition is moderate. Appears well hydrated. Neck: Supple Cardiac: Regular rate and rhythm. Normal S1/S2. No murmurs, rubs, or gallops appreciated. Lungs: Clear bilaterally to ascultation.  Abdomen: Normoactive bowel sounds. No tenderness to deep or light palpation. No rebound or guarding.      Assessments/Plans  Positive pregnancy test, initial OB labs today.  Will obtain beta-hCG given some discrepancy with her periods and recent miscarriage.  If above threshold will pursue ultrasound at this time.  The patient also has a large bill to [facility], encouraged her to apply for pregnancy Medicaid.  We reviewed return precautions including bleeding and ectopic precautions, including abdominal pain or bleeding.  Will schedule for initial prenatal visit with [facility].   History of abnormal Pap  ASCUS + HPV in 2017, repeat NILM and negative for HPV. Will discuss repeat cotesting with Dr. Neal/Dr. 2018  History of Preeclampsia, with positive Urine protein/creatinine in 2018, will need aspirin after 12 weeks   See after visit summary for details of patient instructions  2019, MD  [facility] Medicine Teaching Service

## 2019-05-25 LAB — OBSTETRIC PANEL, INCLUDING HIV
Antibody Screen: NEGATIVE
Basophils Absolute: 0 10*3/uL (ref 0.0–0.2)
Basos: 1 %
EOS (ABSOLUTE): 0.2 10*3/uL (ref 0.0–0.4)
Eos: 3 %
HIV Screen 4th Generation wRfx: NONREACTIVE
Hematocrit: 42 % (ref 34.0–46.6)
Hemoglobin: 13.7 g/dL (ref 11.1–15.9)
Hepatitis B Surface Ag: NEGATIVE
Immature Grans (Abs): 0 10*3/uL (ref 0.0–0.1)
Immature Granulocytes: 0 %
Lymphocytes Absolute: 2.3 10*3/uL (ref 0.7–3.1)
Lymphs: 37 %
MCH: 30.1 pg (ref 26.6–33.0)
MCHC: 32.6 g/dL (ref 31.5–35.7)
MCV: 92 fL (ref 79–97)
Monocytes Absolute: 0.4 10*3/uL (ref 0.1–0.9)
Monocytes: 7 %
Neutrophils Absolute: 3.2 10*3/uL (ref 1.4–7.0)
Neutrophils: 52 %
Platelets: 335 10*3/uL (ref 150–450)
RBC: 4.55 x10E6/uL (ref 3.77–5.28)
RDW: 11.8 % (ref 11.7–15.4)
RPR Ser Ql: NONREACTIVE
Rh Factor: POSITIVE
Rubella Antibodies, IGG: 18.7 index (ref >0.99)
WBC: 6.1 10*3/uL (ref 3.4–10.8)

## 2019-05-25 LAB — BETA HCG QUANT (REF LAB): hCG Quant: 92947 m[IU]/mL

## 2019-05-26 ENCOUNTER — Telehealth: Payer: Self-pay | Admitting: Family Medicine

## 2019-05-26 ENCOUNTER — Encounter: Payer: Self-pay | Admitting: Family Medicine

## 2019-05-26 DIAGNOSIS — Z3493 Encounter for supervision of normal pregnancy, unspecified, third trimester: Secondary | ICD-10-CM | POA: Insufficient documentation

## 2019-05-26 DIAGNOSIS — Z3481 Encounter for supervision of other normal pregnancy, first trimester: Secondary | ICD-10-CM

## 2019-05-26 DIAGNOSIS — Z3480 Encounter for supervision of other normal pregnancy, unspecified trimester: Secondary | ICD-10-CM | POA: Insufficient documentation

## 2019-05-26 LAB — URINE CULTURE, OB REFLEX

## 2019-05-26 LAB — CULTURE, OB URINE

## 2019-05-26 NOTE — Telephone Encounter (Signed)
Call patient with results.  Overall results are within normal limits.  OB urine culture is negative.  Blood type is B+.  Her hCG is in appropriate range. Order transvaginal ultrasound given the fact that her dating is unreliable as she had a recent pregnancy loss just prior to this pregnancy.  Nursing-  Please help to schedule patient's dating ultrasound.  This has been ordered.  Any day of the week works for her, any time of day (at home with her young child)   Annice Pih- please schedule for initial prenatal visit for 60 minutes with the resident provider.  Let me know if there are questions.  Terisa Starr, MD  Family Medicine Teaching Service

## 2019-05-26 NOTE — Telephone Encounter (Signed)
Called patient with appointment for [place] at [facility] on 06/02/2019 at 1130.  Arrival time is 1115 am.  Patient verbalizes understanding and is appreciative.  Glennie Hawk, CMA

## 2019-06-02 ENCOUNTER — Other Ambulatory Visit (HOSPITAL_COMMUNITY): Payer: Self-pay

## 2019-06-07 ENCOUNTER — Other Ambulatory Visit: Payer: Self-pay

## 2019-06-07 ENCOUNTER — Ambulatory Visit (HOSPITAL_COMMUNITY)
Admission: RE | Admit: 2019-06-07 | Discharge: 2019-06-07 | Disposition: A | Discharge status: Home / Self Care | Payer: Medicaid Other | Source: Ambulatory Visit | Attending: Family Medicine | Admitting: Family Medicine

## 2019-06-07 DIAGNOSIS — Z3481 Encounter for supervision of other normal pregnancy, first trimester: Secondary | ICD-10-CM | POA: Insufficient documentation

## 2019-06-09 ENCOUNTER — Telehealth: Payer: Self-pay | Admitting: Family Medicine

## 2019-06-09 NOTE — Telephone Encounter (Signed)
I have talked to the patient and she has scheduled her first OB for 06/21/19 at 9:45am. This is the first time she can come in since she works. jw

## 2019-06-10 NOTE — Telephone Encounter (Signed)
Please see phone note from 06/09/19 for followup. Jone Baseman, CMA

## 2019-06-21 ENCOUNTER — Ambulatory Visit (INDEPENDENT_AMBULATORY_CARE_PROVIDER_SITE_OTHER): Payer: Medicaid Other | Admitting: Family Medicine

## 2019-06-21 ENCOUNTER — Other Ambulatory Visit: Payer: Self-pay

## 2019-06-21 ENCOUNTER — Other Ambulatory Visit (HOSPITAL_COMMUNITY)
Admission: RE | Admit: 2019-06-21 | Discharge: 2019-06-21 | Disposition: A | Discharge status: Home / Self Care | Payer: Medicaid Other | Source: Ambulatory Visit | Attending: Family Medicine | Admitting: Family Medicine

## 2019-06-21 ENCOUNTER — Encounter: Payer: Self-pay | Admitting: Family Medicine

## 2019-06-21 DIAGNOSIS — Z3480 Encounter for supervision of other normal pregnancy, unspecified trimester: Secondary | ICD-10-CM | POA: Diagnosis not present

## 2019-06-21 LAB — POCT URINALYSIS DIP (MANUAL ENTRY)
Bilirubin, UA: NEGATIVE
Blood, UA: NEGATIVE
Glucose, UA: NEGATIVE mg/dL
Ketones, POC UA: NEGATIVE mg/dL
Nitrite, UA: NEGATIVE
Protein Ur, POC: NEGATIVE mg/dL
Spec Grav, UA: 1.02
Urobilinogen, UA: 0.2 U/dL
pH, UA: 8

## 2019-06-21 LAB — POCT WET PREP (WET MOUNT)
Clue Cells Wet Prep Whiff POC: NEGATIVE
Trichomonas Wet Prep HPF POC: ABSENT
WBC, Wet Prep HPF POC: >20

## 2019-06-21 LAB — POCT UA - MICROSCOPIC ONLY

## 2019-06-21 NOTE — Patient Instructions (Signed)
It was a pleasure to meet you today.  Your baby's heart rate was around 150 bpm which is normal.  We performed a pelvic exam and collected some routine prenatal labs and samples.  I have ordered a nuchal translucency test which is the first trimester screening.  It will require an ultrasound by the MFM which has been called and scheduled.  I would like for you to follow-up in 4 weeks with our clinic.  It can be with our OB clinic and will be your second trimester visit with them.  If you have any vaginal bleeding, loss of fluid, feel regular contractions which are increasing in strength please seek medical attention.

## 2019-06-21 NOTE — Progress Notes (Signed)
Patient Name: Lauren Douglas Date of Birth: 1991/02/13 [facility] Initial Prenatal Visit  Lauren Douglas is a 29 y.o. year old G3P1011 at Unknown who presents for her initial prenatal visit. Pregnancy is planned She reports breast tenderness and positive home pregnancy test. She is taking a prenatal vitamin.  She denies pelvic pain or vaginal bleeding.   Pregnancy Dating: . The patient is dated by LMP+ 11 week ultrasound.  Marland Kitchen LMP: 03/25/2019 last day was 04/01/2019 . Period is certain:  Yes.  . Periods were regular:  Yes.  Marland Kitchen LMP was a typical period:  No.  Heavy period in January. Miscarriage in December.  . Using hormonal contraception in 3 months prior to conception: No  Lab Review: . Blood type: B . Rh Status: + . Antibody screen: Negative . HIV: Negative . RPR: Negative . Hemoglobin electrophoresis reviewed: Yes Hb AS (Sickle cell trait) detected on previous electrophoresis  . Results of OB urine culture are: Negative . Rubella: Immune . Varicella status is Immune  PMH: Reviewed and as detailed below: . HTN: Yes . Type 1 or 2 Diabetes: No  . Depression:  No  . Seizure disorder:  No . VTE: No ,  . History of STI No,  . Abnormal Pap smear:  Yes, . Genital herpes simplex:  No   PSH: . Gynecologic Surgery:  no . Surgical history reviewed, notable for: NA  Obstetric History: . Obstetric history tab updated and reviewed.  . Summary of prior pregnancies: First pregnancy was 10/2016. Uncomplicated vaginal delivery. . Cesarean delivery: No  . Gestational Diabetes:  No . Hypertension in pregnancy: No, developed right after delivery. Leg swelling, elevated blood pressures.  . History of preterm birth: No . History of LGA/SGA infant:  No . History of shoulder dystocia: No . Indications for referral were reviewed, and the patient has no obstetric indications for referral to High Risk OB Clinic at this time.   Social History: . Partner's name: Lauren  Douglas   . Tobacco use: No . Alcohol use:  No . Other substance use:  No  Current Medications:  . Prenatal, Tylenol PRN for headaches   . Reviewed and appropriate in pregnancy.   Genetic and Infection Screen: . Flow Sheet Updated Yes  Prenatal Exam: Vitals:   06/21/19 1003  BP: 118/78  Pulse: 87   Gen: Well nourished, well developed.  No distress.  Vitals noted. HEENT: Normocephalic, atraumatic.  Neck supple without cervical lymphadenopathy, thyromegaly or thyroid nodules. CV: RRR no murmur, gallops or rubs Lungs: CTA B.  Normal respiratory effort without wheezes or rales. Abd: soft, NTND. +BS.  Uterus not appreciated above pelvis. GU: Normal external female genitalia without lesions.  Nl vaginal, well rugated without lesions.  Mild but appropriate vaginal discharge.  Cervix has irritation on anterior cuff.  Patient reports tenaculum placed at last pelvic exam in December correlating with irritated area.  Bimanual exam: No adnexal mass or TTP. No CMT.   Ext: No clubbing, cyanosis or edema. Psych: Normal grooming and dress.  Not depressed or anxious appearing.  Normal thought content and process without flight of ideas or looseness of associations   Fetal heart tones: Appropriate 150   Assessment/Plan:  Lauren Douglas is a 29 y.o. G3P1011 at Unknown who presents to initiate prenatal care. She is doing well.  Current pregnancy issues include previous recent SAB (02/2019).  1. Routine prenatal care: Marland Kitchen As dating Ultrasound has already been performed.  Dating has been updated . Pre-pregnancy  weight updated. Expected weight gain this pregnancy is 25-35 pounds  . Prenatal labs reviewed. . Indications for referral to [facility] were reviewed and the patient does not meet criteria for referral.  . Medication list reviewed and updated.  . Recommended patient see a dentist for regular care.  . Bleeding and pain precautions reviewed. . Importance of prenatal vitamins reviewed.  . Genetic  screening offered. Patient opted for: first trimester screen with nuchal translucency at 11-13 weeks. . The patient will not be age 74 or over at time of delivery. Referral to genetic counseling was not offered today.  . The patient has the following risk factors for preexisting diabetes: BMI > 25 and high risk ethnicity (Latino, [place] American, Native American, [place], Asian Optometrist) . An early 1 hour glucose tolerance test was not ordered. . Pregnancy Medical Home and PHQ-9 forms completed, problems noted: Yes patient reports issues with exhaustion and sleep.  Denies any depression or SI.  Follow up 4 weeks for next prenatal visit.

## 2019-06-22 ENCOUNTER — Encounter (HOSPITAL_COMMUNITY): Payer: Self-pay

## 2019-06-22 ENCOUNTER — Ambulatory Visit (HOSPITAL_COMMUNITY): Payer: Medicaid Other | Admitting: *Deleted

## 2019-06-22 ENCOUNTER — Ambulatory Visit (HOSPITAL_COMMUNITY): Payer: Medicaid Other

## 2019-06-22 ENCOUNTER — Ambulatory Visit (HOSPITAL_COMMUNITY)
Admission: RE | Admit: 2019-06-22 | Discharge: 2019-06-22 | Disposition: A | Discharge status: Home / Self Care | Payer: Medicaid Other | Source: Ambulatory Visit | Attending: Family Medicine | Admitting: Family Medicine

## 2019-06-22 DIAGNOSIS — Z3682 Encounter for antenatal screening for nuchal translucency: Secondary | ICD-10-CM | POA: Diagnosis not present

## 2019-06-22 DIAGNOSIS — O09299 Supervision of pregnancy with other poor reproductive or obstetric history, unspecified trimester: Secondary | ICD-10-CM

## 2019-06-22 DIAGNOSIS — Z3481 Encounter for supervision of other normal pregnancy, first trimester: Secondary | ICD-10-CM | POA: Insufficient documentation

## 2019-06-22 DIAGNOSIS — Z369 Encounter for antenatal screening, unspecified: Secondary | ICD-10-CM | POA: Insufficient documentation

## 2019-06-22 DIAGNOSIS — Z3A13 13 weeks gestation of pregnancy: Secondary | ICD-10-CM

## 2019-06-22 DIAGNOSIS — Z3480 Encounter for supervision of other normal pregnancy, unspecified trimester: Secondary | ICD-10-CM | POA: Diagnosis present

## 2019-06-22 DIAGNOSIS — Z862 Personal history of diseases of the blood and blood-forming organs and certain disorders involving the immune mechanism: Secondary | ICD-10-CM

## 2019-06-22 LAB — CERVICOVAGINAL ANCILLARY ONLY
Chlamydia: NEGATIVE
Comment: NEGATIVE
Comment: NORMAL
Neisseria Gonorrhea: NEGATIVE

## 2019-06-24 LAB — FIRST TRIMESTER SCREEN W/NT
CRL: 75.9 mm
DIA MoM: 0.85
DIA Value: 156.5 pg/mL
Gest Age-Collect: 13.4 weeks
Maternal Age At EDD: 28.9 yr
Nuchal Translucency MoM: 1.41
Nuchal Translucency: 2.3 mm
Number of Fetuses: 1
PAPP-A MoM: 0.48
PAPP-A Value: 583.9 ng/mL
Test Results:: NEGATIVE
Weight: 169 [lb_av]
hCG MoM: 1.32
hCG Value: 97.2 IU/mL

## 2019-06-25 ENCOUNTER — Telehealth (HOSPITAL_COMMUNITY): Payer: Self-pay | Admitting: Genetic Counselor

## 2019-06-25 NOTE — Telephone Encounter (Signed)
I called Lauren Douglas to discuss her negative first trimester screen results. We reviewed that the risk for her pregnancy to be affected by Down syndrome is 1 in 1600 and the risk for trisomy 11 is 1 in 2700 based on the results of this screen. Ms. Sachdev was reminded that while this screen significantly reduces the likelihood of the pregnancy being affected by trisomy 43 or trisomy 37, it cannot be considered diagnostic. Diagnostic testing via amniocentesis is available any time after 16 weeks' gestation should she be interested in pursuing this. First trimester screening also does not screen for open neural tube defects such as spina bifida, so it is recommended that MS-AFP screening be ordered around 16-18 weeks to screen for this. Ms. Kolek confirmed that she had no questions about these results.  Gershon Crane, MS Genetic Counselor

## 2019-07-18 NOTE — Progress Notes (Signed)
Patient Name: Lauren Douglas Date of Birth: November 01, 1990 Date of Visit: 07/19/19 PCP: Martyn Malay, MD Second Trimester Prenatal Visit   Chief Complaint: prenatal care  Subjective: Lauren Douglas is a pleasant G3P1011 at  [redacted]w[redacted]d dated by 1st trimester [place]. She has no unusual complaints today.  She denies vaginal bleeding or discharge.   Patient does report 2 weeks ago she was having sharp abdominal pain.  This lasted between 1 PM to 4 PM.  Is now resolved.  Denies any leakage of blood, fluid, discharge.  States she was feeling the baby from the initiation of pregnancy but ever since she has not felt the baby any longer.   ROS: Per HPI.   I have reviewed the patient's medical, surgical, family, and social history as appropriate.  [place] Reviewed: Nuchal translucency wnl, anatomy for GA wnl Anatomy scan scheduled for 08/04/19 Labs reviewed: negative quad screen, HepBSAg neg, RPR NR, Rubella immune, B positive, antibody negative, HIV NR< Hgb 13.7, GC/chlamydia negative,   Prior Obstetric History: h/o PreE with previous child, h/o SAB  Complications of Current Pregnancy:   Obesity Pre-pregnancy weight: 166lbs Current weight: 174lbs Weight gain: 8lbs Goal weight gain: 11-20lbs  H/o PreE Chart review showing a history of preeclampsia with previous pregnancy.  H&P from her previous pregnancy delivery showing pre-E with UPC of 1.73.  HELLP labs were within normal limits at that time.  Patient was started on baby aspirin today.  Sickle cell trait Patient with a history of sickle cell trait.  Discussed genetic testing at this time.  Patient refused.  States that she had genetic testing with her previous pregnancy and she was told that she is a carrier her husband is not there for their chances of having a child with sickle cell are unlikely.  States that she will wait for birth to have normal newborn screen.  Supervision of low risk pregnancy . Dating reviewed, dating tab is  correct . Fetal heart tones Appropriate.  Initially difficult to hear fetal heart tones.  I discussed with Dr. Ardelia Mems.  We were able to do bedside ultrasound and view heart rate on ultrasound.  Dopplers were then obtained showing fetal heart rate of 146. Marland Kitchen Pregnancy issues include see above. . Problem list updated: Yes.  . Influenza vaccine previously administered.   . The patient has the following indication for screening preexisting diabetes: BMI > 25 and high risk ethnicity (Latino, [place] American, Native American, [place], Asian Optometrist) . Marland Kitchen Anatomy ultrasound ordered to be scheduled at 18-20 weeks. . Patient is not interested in genetic screening. Quad screen previously obtained . Pregnancy education including expected weight gain in pregnancy, OTC medication use, continued use of prenatal vitamin, smoking cessation if applicable, and nutrition in pregnancy.   . Bleeding and pain precautions reviewed. . Early glucola obtained today, elevated. 3hr GTT scheduled for 5/4 . Follow up 4 weeks.  Vitals:   07/19/19 0928  BP: 110/72  Pulse: 97   Last Weight  Most recent update: 07/19/2019  9:28 AM   Weight  78.9 kg (174 lb)           See prenatal flowsheet for exam.  Fetal heart tones documented in flow sheet  Fundal height (beginning at 20 w) documented in flow sheet     Lauren Douglas is a pleasant G3P1011 at [redacted]w[redacted]d presenting for routine second trimester prenatal care. Overall she is doing well.   Anticipatory Guidance and Prenatal Education provided on the following topics: - Reviewed nutrition  in pregnancy, avoiding cat litter, unpasteurized cheeses - Recommended continuing Prenatal vitamin  - Discussed options for Contraception postpartum - Discussed mode of delivery - Discussed pain control in labor - Discussed  breastfeeding and benefits for infant  - Problem list and prenatal box updated  - Anatomy scan scheduled - Scheduled in [facility] clinic on  08/12/19 - early glucola obtained today    Discussed patient with Dr. Pollie Meyer  Follow up in 4 weeks.

## 2019-07-19 ENCOUNTER — Ambulatory Visit: Payer: Medicaid Other | Admitting: Family Medicine

## 2019-07-19 ENCOUNTER — Other Ambulatory Visit: Payer: Self-pay

## 2019-07-19 VITALS — BP 110/72 | HR 97 | Wt 174.0 lb

## 2019-07-19 DIAGNOSIS — Z3A17 17 weeks gestation of pregnancy: Secondary | ICD-10-CM | POA: Diagnosis present

## 2019-07-19 DIAGNOSIS — Z6832 Body mass index (BMI) 32.0-32.9, adult: Secondary | ICD-10-CM

## 2019-07-19 DIAGNOSIS — E669 Obesity, unspecified: Secondary | ICD-10-CM | POA: Diagnosis not present

## 2019-07-19 DIAGNOSIS — Z3480 Encounter for supervision of other normal pregnancy, unspecified trimester: Secondary | ICD-10-CM | POA: Diagnosis not present

## 2019-07-19 LAB — POCT 1 HR PRENATAL GLUCOSE: Glucose 1 Hr Prenatal, POC: 138 mg/dL

## 2019-07-19 MED ORDER — ASPIRIN EC 81 MG PO TBEC: 81.0000 mg | DELAYED_RELEASE_TABLET | Freq: Every day | ORAL | 4 refills | Status: DC

## 2019-07-19 NOTE — Patient Instructions (Addendum)

## 2019-07-20 ENCOUNTER — Other Ambulatory Visit (INDEPENDENT_AMBULATORY_CARE_PROVIDER_SITE_OTHER): Payer: Medicaid Other

## 2019-07-20 DIAGNOSIS — Z3A17 17 weeks gestation of pregnancy: Secondary | ICD-10-CM | POA: Diagnosis not present

## 2019-07-20 LAB — POCT CBG (FASTING - GLUCOSE)-MANUAL ENTRY: Glucose Fasting, POC: 93 mg/dL (ref 70–99)

## 2019-07-21 LAB — GESTATIONAL GLUCOSE TOLERANCE
Glucose, Fasting: 85 mg/dL (ref 65–94)
Glucose, GTT - 1 Hour: 148 mg/dL (ref 65–179)
Glucose, GTT - 2 Hour: 116 mg/dL (ref 65–154)
Glucose, GTT - 3 Hour: 115 mg/dL (ref 65–139)

## 2019-07-30 ENCOUNTER — Other Ambulatory Visit: Payer: Medicaid Other

## 2019-08-04 ENCOUNTER — Other Ambulatory Visit: Payer: Self-pay | Admitting: Family Medicine

## 2019-08-04 ENCOUNTER — Other Ambulatory Visit: Payer: Self-pay | Admitting: *Deleted

## 2019-08-04 ENCOUNTER — Ambulatory Visit: Payer: Medicaid Other | Attending: Family Medicine

## 2019-08-04 ENCOUNTER — Other Ambulatory Visit: Payer: Self-pay

## 2019-08-04 DIAGNOSIS — E669 Obesity, unspecified: Secondary | ICD-10-CM | POA: Diagnosis not present

## 2019-08-04 DIAGNOSIS — O99012 Anemia complicating pregnancy, second trimester: Secondary | ICD-10-CM | POA: Diagnosis not present

## 2019-08-04 DIAGNOSIS — O09292 Supervision of pregnancy with other poor reproductive or obstetric history, second trimester: Secondary | ICD-10-CM | POA: Diagnosis not present

## 2019-08-04 DIAGNOSIS — Z363 Encounter for antenatal screening for malformations: Secondary | ICD-10-CM | POA: Diagnosis not present

## 2019-08-04 DIAGNOSIS — Z862 Personal history of diseases of the blood and blood-forming organs and certain disorders involving the immune mechanism: Secondary | ICD-10-CM | POA: Diagnosis not present

## 2019-08-04 DIAGNOSIS — O321XX0 Maternal care for breech presentation, not applicable or unspecified: Secondary | ICD-10-CM | POA: Insufficient documentation

## 2019-08-04 DIAGNOSIS — Z3A17 17 weeks gestation of pregnancy: Secondary | ICD-10-CM

## 2019-08-04 DIAGNOSIS — Z3689 Encounter for other specified antenatal screening: Secondary | ICD-10-CM | POA: Insufficient documentation

## 2019-08-04 DIAGNOSIS — D573 Sickle-cell trait: Secondary | ICD-10-CM | POA: Insufficient documentation

## 2019-08-04 DIAGNOSIS — Z362 Encounter for other antenatal screening follow-up: Secondary | ICD-10-CM

## 2019-08-04 DIAGNOSIS — Z3A19 19 weeks gestation of pregnancy: Secondary | ICD-10-CM | POA: Insufficient documentation

## 2019-08-04 DIAGNOSIS — O99212 Obesity complicating pregnancy, second trimester: Secondary | ICD-10-CM | POA: Diagnosis not present

## 2019-08-12 ENCOUNTER — Other Ambulatory Visit: Payer: Self-pay

## 2019-08-12 ENCOUNTER — Ambulatory Visit: Payer: Medicaid Other | Admitting: Family Medicine

## 2019-08-12 DIAGNOSIS — Z3480 Encounter for supervision of other normal pregnancy, unspecified trimester: Secondary | ICD-10-CM

## 2019-08-12 NOTE — Progress Notes (Signed)
[facility] Visit  Lauren Douglas is a 29 y.o. G3P1011 at [redacted]w[redacted]d (via [redacted]w[redacted]d u/s) who presents to [facility] for routine follow up. Seen today along with medical student Christiana Cornea. Prenatal course, history, notes, ultrasounds, and laboratory results reviewed.  Denies cramping/ctx, fluid leaking, or vaginal bleeding. Taking PNV, tylenol, and aspirin. Patient reports has not yet felt baby move, and that in her first pregnancy she never felt the baby move either (was born term SVD).  Primary Prenatal Care Provider: Dr. Nobie Putnam  Postpartum Plans: - delivery planning: IV medications, no epidural - circumcision: yes - feeding: breast, BF her first child for 13 months  - pediatrician: [facility] - contraception: depo  FHR: 148bpm Uterine size: 26cm  Assessment & Plan  1. Routine prenatal care: - has not yet felt baby move (though never felt first child moving, curiously). Continue to monitor for fetal movement. - continue PNV - would recommend regular 1hr GTT at next visit in 4 weeks - monitor weight gain at each visit, TWG already nearly 19lb  2. Size>dates - suspect maternal habitus contributing to inaccurate measurement. Just had u/s on 5/19 with growth scan already scheduled 6/16.   3. History of preE without severe features in G1 pregnancy - continue aspirin 81mg  daily for preE prevention  4. Sickle cell trait - partner previously tested and negative.  Next prenatal visit in 4 weeks. Labor & fetal movement precautions discussed.  , MD [facility]

## 2019-09-01 ENCOUNTER — Ambulatory Visit: Payer: Medicaid Other | Attending: Obstetrics and Gynecology

## 2019-09-01 ENCOUNTER — Other Ambulatory Visit: Payer: Self-pay

## 2019-09-01 DIAGNOSIS — O99212 Obesity complicating pregnancy, second trimester: Secondary | ICD-10-CM | POA: Diagnosis not present

## 2019-09-01 DIAGNOSIS — E669 Obesity, unspecified: Secondary | ICD-10-CM | POA: Diagnosis not present

## 2019-09-01 DIAGNOSIS — Z862 Personal history of diseases of the blood and blood-forming organs and certain disorders involving the immune mechanism: Secondary | ICD-10-CM | POA: Diagnosis not present

## 2019-09-01 DIAGNOSIS — Z362 Encounter for other antenatal screening follow-up: Secondary | ICD-10-CM | POA: Insufficient documentation

## 2019-09-01 DIAGNOSIS — Z3A23 23 weeks gestation of pregnancy: Secondary | ICD-10-CM | POA: Diagnosis not present

## 2019-09-01 DIAGNOSIS — O09292 Supervision of pregnancy with other poor reproductive or obstetric history, second trimester: Secondary | ICD-10-CM | POA: Diagnosis not present

## 2019-09-13 ENCOUNTER — Encounter: Payer: Medicaid Other | Admitting: Family Medicine

## 2019-09-16 ENCOUNTER — Ambulatory Visit (INDEPENDENT_AMBULATORY_CARE_PROVIDER_SITE_OTHER): Payer: Medicaid Other | Admitting: Family Medicine

## 2019-09-16 ENCOUNTER — Other Ambulatory Visit: Payer: Self-pay

## 2019-09-16 VITALS — BP 116/68 | HR 103 | Wt 184.6 lb

## 2019-09-16 DIAGNOSIS — Z3480 Encounter for supervision of other normal pregnancy, unspecified trimester: Secondary | ICD-10-CM

## 2019-09-16 LAB — POCT 1 HR PRENATAL GLUCOSE: Glucose 1 Hr Prenatal, POC: 198 mg/dL

## 2019-09-16 NOTE — Patient Instructions (Signed)
Go to the MAU at [facility] at [facility] if:  You have cramping/contractions that do not go away with drinking water  Your water breaks.  Sometimes it is a big gush of fluid, sometimes it is just a trickle that keeps getting your underwear wet or running down your legs  You have vaginal bleeding.     You do not feel your baby moving like normal.  If you do not, get something to eat and drink and lay down and focus on feeling your baby move. If your baby is still not moving like normal, you should go to MAU.   Next visit in 3 weeks with Dr. Nobie Putnam  Be well, Dr. Pollie Meyer     Round Ligament Pain  The round ligament is a cord of muscle and tissue that helps support the uterus. It can become a source of pain during pregnancy if it becomes stretched or twisted as the baby grows. The pain usually begins in the second trimester (13-28 weeks) of pregnancy, and it can come and go until the baby is delivered. It is not a serious problem, and it does not cause harm to the baby. Round ligament pain is usually a short, sharp, and pinching pain, but it can also be a dull, lingering, and aching pain. The pain is felt in the lower side of the abdomen or in the groin. It usually starts deep in the groin and moves up to the outside of the hip area. The pain may occur when you:  Suddenly change position, such as quickly going from a sitting to standing position.  Roll over in bed.  Cough or sneeze.  Do physical activity. Follow these instructions at home:   Watch your condition for any changes.  When the pain starts, relax. Then try any of these methods to help with the pain: ? Sitting down. ? Flexing your knees up to your abdomen. ? Lying on your side with one pillow under your abdomen and another pillow between your legs. ? Sitting in a warm bath for 15-20 minutes or until the pain goes away.  Take over-the-counter and prescription medicines only as told by your health care  provider.  Move slowly when you sit down or stand up.  Avoid long walks if they cause pain.  Stop or reduce your physical activities if they cause pain.  Keep all follow-up visits as told by your health care provider. This is important. Contact a health care provider if:  Your pain does not go away with treatment.  You feel pain in your back that you did not have before.  Your medicine is not helping. Get help right away if:  You have a fever or chills.  You develop uterine contractions.  You have vaginal bleeding.  You have nausea or vomiting.  You have diarrhea.  You have pain when you urinate. Summary  Round ligament pain is felt in the lower abdomen or groin. It is usually a short, sharp, and pinching pain. It can also be a dull, lingering, and aching pain.  This pain usually begins in the second trimester (13-28 weeks). It occurs because the uterus is stretching with the growing baby, and it is not harmful to the baby.  You may notice the pain when you suddenly change position, when you cough or sneeze, or during physical activity.  Relaxing, flexing your knees to your abdomen, lying on one side, or taking a warm bath may help to get rid of  the pain.  Get help from your health care provider if the pain does not go away or if you have vaginal bleeding, nausea, vomiting, diarrhea, or painful urination. This information is not intended to replace advice given to you by your health care provider. Make sure you discuss any questions you have with your health care provider. Document Revised: 08/20/2017 Document Reviewed: 08/20/2017 Elsevier Patient Education  2020 [employer].

## 2019-09-16 NOTE — Progress Notes (Signed)
[facility]  Lauren Douglas is a 29 y.o. G3P1011 at [redacted]w[redacted]d (via [redacted]w[redacted]d u/s) who presents for routine follow up. Was scheduled to see Dr. Nobie Putnam today, but he was dealing with an emergency, so I saw patient instead.  Denies cramping/ctx, fluid leaking, or vaginal bleeding. Taking PNV, tylenol, and aspirin. Feels baby move daily, especially when she is laying still on her side. Does endorse lower abdominal pain on the R side, worse with standing or rolling over in bed.  Exam: BP 116/68   Pulse (!) 103   Wt 184 lb 9.6 oz (83.7 kg)   LMP 03/25/2019 (Exact Date)   BMI 34.88 kg/m   FHR: 145bpm Uterine size: 28cm Abdomen: soft, gravid, nontender to palpation  Assessment & Plan  1. Routine prenatal care: - continue PNV - 1hr GTT today - advised of excessive TWG (already 22.5lb) - encouraged healthy food and drink choices, patient admits to drinking juice and eating fried foods - plan for Tdap, CBC, HIV, RPR at next visit. - labor & fetal movement precautions discussed  2. History of preE without severe features in G1 pregnancy - continue aspirin 81mg  daily for preE prevention. Blood pressure normal today.  3. Sickle cell trait - partner previously tested and negative.  4. Round ligament pain - discussed etiology with patient, gave handout.   Next prenatal visit in 3 weeks.   , MD [facility]

## 2019-09-17 ENCOUNTER — Other Ambulatory Visit: Payer: Self-pay | Admitting: Family Medicine

## 2019-09-17 DIAGNOSIS — Z3480 Encounter for supervision of other normal pregnancy, unspecified trimester: Secondary | ICD-10-CM

## 2019-09-27 ENCOUNTER — Other Ambulatory Visit: Payer: Medicaid Other

## 2019-09-27 ENCOUNTER — Other Ambulatory Visit: Payer: Self-pay | Admitting: Family Medicine

## 2019-09-27 ENCOUNTER — Other Ambulatory Visit: Payer: Self-pay

## 2019-09-27 DIAGNOSIS — Z3480 Encounter for supervision of other normal pregnancy, unspecified trimester: Secondary | ICD-10-CM | POA: Diagnosis not present

## 2019-09-28 LAB — RPR: RPR Ser Ql: NONREACTIVE

## 2019-09-28 LAB — GESTATIONAL GLUCOSE TOLERANCE
Glucose, Fasting: 94 mg/dL (ref 65–94)
Glucose, GTT - 1 Hour: 208 mg/dL — ABNORMAL HIGH (ref 65–179)
Glucose, GTT - 2 Hour: 135 mg/dL (ref 65–154)
Glucose, GTT - 3 Hour: 127 mg/dL (ref 65–139)

## 2019-09-28 LAB — CBC
Hematocrit: 35.3 % (ref 34.0–46.6)
Hemoglobin: 12.2 g/dL (ref 11.1–15.9)
MCH: 30.5 pg (ref 26.6–33.0)
MCHC: 34.6 g/dL (ref 31.5–35.7)
MCV: 88 fL (ref 79–97)
Platelets: 282 10*3/uL (ref 150–450)
RBC: 4 x10E6/uL (ref 3.77–5.28)
RDW: 12.9 % (ref 11.7–15.4)
WBC: 7.7 10*3/uL (ref 3.4–10.8)

## 2019-09-28 LAB — HIV ANTIBODY (ROUTINE TESTING W REFLEX): HIV Screen 4th Generation wRfx: NONREACTIVE

## 2019-10-07 ENCOUNTER — Other Ambulatory Visit: Payer: Self-pay

## 2019-10-07 ENCOUNTER — Ambulatory Visit (INDEPENDENT_AMBULATORY_CARE_PROVIDER_SITE_OTHER): Payer: Medicaid Other | Admitting: Family Medicine

## 2019-10-07 VITALS — BP 108/68 | HR 100 | Wt 186.2 lb

## 2019-10-07 DIAGNOSIS — Z23 Encounter for immunization: Secondary | ICD-10-CM

## 2019-10-07 DIAGNOSIS — Z3480 Encounter for supervision of other normal pregnancy, unspecified trimester: Secondary | ICD-10-CM

## 2019-10-07 NOTE — Patient Instructions (Signed)
It was wonderful to see you today!  Everything looks great.  We are giving you a vaccine today called a Tdap vaccine.  We will see you again in approximately 2 weeks for a routine OB visit.  I will be able to follow-up with you on 8/24 for another routine OB visit.  You can also schedule your daughter to see me for her well-child at that time.  Please asked the front office to schedule that appointment for you.  I hope you have a wonderful afternoon!   Third Trimester of Pregnancy  The third trimester is from week 28 through week 40 (months 7 through 9). This trimester is when your unborn baby (fetus) is growing very fast. At the end of the ninth month, the unborn baby is about 20 inches in length. It weighs about 6-10 pounds. Follow these instructions at home: Medicines  Take over-the-counter and prescription medicines only as told by your doctor. Some medicines are safe and some medicines are not safe during pregnancy.  Take a prenatal vitamin that contains at least 600 micrograms (mcg) of folic acid.  If you have trouble pooping (constipation), take medicine that will make your stool soft (stool softener) if your doctor approves. Eating and drinking   Eat regular, healthy meals.  Avoid raw meat and uncooked cheese.  If you get low calcium from the food you eat, talk to your doctor about taking a daily calcium supplement.  Eat four or five small meals rather than three large meals a day.  Avoid foods that are high in fat and sugars, such as fried and sweet foods.  To prevent constipation: ? Eat foods that are high in fiber, like fresh fruits and vegetables, whole grains, and beans. ? Drink enough fluids to keep your pee (urine) clear or pale yellow. Activity  Exercise only as told by your doctor. Stop exercising if you start to have cramps.  Avoid heavy lifting, wear low heels, and sit up straight.  Do not exercise if it is too hot, too humid, or if you are in a place of great  height (high altitude).  You may continue to have sex unless your doctor tells you not to. Relieving pain and discomfort  Wear a good support bra if your breasts are tender.  Take frequent breaks and rest with your legs raised if you have leg cramps or low back pain.  Take warm water baths (sitz baths) to soothe pain or discomfort caused by hemorrhoids. Use hemorrhoid cream if your doctor approves.  If you develop puffy, bulging veins (varicose veins) in your legs: ? Wear support hose or compression stockings as told by your doctor. ? Raise (elevate) your feet for 15 minutes, 3-4 times a day. ? Limit salt in your food. Safety  Wear your seat belt when driving.  Make a list of emergency phone numbers, including numbers for family, friends, the hospital, and police and fire departments. Preparing for your baby's arrival To prepare for the arrival of your baby:  Take prenatal classes.  Practice driving to the hospital.  Visit the hospital and tour the maternity area.  Talk to your work about taking leave once the baby comes.  Pack your hospital bag.  Prepare the baby's room.  Go to your doctor visits.  Buy a rear-facing car seat. Learn how to install it in your car. General instructions  Do not use hot tubs, steam rooms, or saunas.  Do not use any products that contain nicotine or tobacco,  such as cigarettes and e-cigarettes. If you need help quitting, ask your doctor.  Do not drink alcohol.  Do not douche or use tampons or scented sanitary pads.  Do not cross your legs for long periods of time.  Do not travel for long distances unless you must. Only do so if your doctor says it is okay.  Visit your dentist if you have not gone during your pregnancy. Use a soft toothbrush to brush your teeth. Be gentle when you floss.  Avoid cat litter boxes and soil used by cats. These carry germs that can cause birth defects in the baby and can cause a loss of your baby  (miscarriage) or stillbirth.  Keep all your prenatal visits as told by your doctor. This is important. Contact a doctor if:  You are not sure if you are in labor or if your water has broken.  You are dizzy.  You have mild cramps or pressure in your lower belly.  You have a nagging pain in your belly area.  You continue to feel sick to your stomach, you throw up, or you have watery poop.  You have bad smelling fluid coming from your vagina.  You have pain when you pee. Get help right away if:  You have a fever.  You are leaking fluid from your vagina.  You are spotting or bleeding from your vagina.  You have severe belly cramps or pain.  You lose or gain weight quickly.  You have trouble catching your breath and have chest pain.  You notice sudden or extreme puffiness (swelling) of your face, hands, ankles, feet, or legs.  You have not felt the baby move in over an hour.  You have severe headaches that do not go away with medicine.  You have trouble seeing.  You are leaking, or you are having a gush of fluid, from your vagina before you are 37 weeks.  You have regular belly spasms (contractions) before you are 37 weeks. Summary  The third trimester is from week 28 through week 40 (months 7 through 9). This time is when your unborn baby is growing very fast.  Follow your doctor's advice about medicine, food, and activity.  Get ready for the arrival of your baby by taking prenatal classes, getting all the baby items ready, preparing the baby's room, and visiting your doctor to be checked.  Get help right away if you are bleeding from your vagina, or you have chest pain and trouble catching your breath, or if you have not felt your baby move in over an hour. This information is not intended to replace advice given to you by your health care provider. Make sure you discuss any questions you have with your health care provider. Document Revised: 06/25/2018 Document  Reviewed: 04/09/2016 Elsevier Patient Education  2020 [employer].  https://www.cdc.gov/vaccines/hcp/vis/vis-statements/tdap.pdf">  Tdap (Tetanus, Diphtheria, Pertussis) Vaccine: What You Need to Know 1. Why get vaccinated? Tdap vaccine can prevent tetanus, diphtheria, and pertussis. Diphtheria and pertussis spread from person to person. Tetanus enters the body through cuts or wounds.  TETANUS (T) causes painful stiffening of the muscles. Tetanus can lead to serious health problems, including being unable to open the mouth, having trouble swallowing and breathing, or death.  DIPHTHERIA (D) can lead to difficulty breathing, heart failure, paralysis, or death.  PERTUSSIS (aP), also known as "whooping cough," can cause uncontrollable, violent coughing which makes it hard to breathe, eat, or drink. Pertussis can be extremely serious in babies and  young children, causing pneumonia, convulsions, brain damage, or death. In teens and adults, it can cause weight loss, loss of bladder control, passing out, and rib fractures from severe coughing. 2. Tdap vaccine Tdap is only for children 7 years and older, adolescents, and adults.  Adolescents should receive a single dose of Tdap, preferably at age 29 or 12 years. Pregnant women should get a dose of Tdap during every pregnancy, to protect the newborn from pertussis. Infants are most at risk for severe, life-threatening complications from pertussis. Adults who have never received Tdap should get a dose of Tdap. Also, adults should receive a booster dose every 10 years, or earlier in the case of a severe and dirty wound or burn. Booster doses can be either Tdap or Td (a different vaccine that protects against tetanus and diphtheria but not pertussis). Tdap may be given at the same time as other vaccines. 3. Talk with your health care provider Tell your vaccine provider if the person getting the vaccine:  Has had an allergic reaction after a previous  dose of any vaccine that protects against tetanus, diphtheria, or pertussis, or has any severe, life-threatening allergies.  Has had a coma, decreased level of consciousness, or prolonged seizures within 7 days after a previous dose of any pertussis vaccine (DTP, DTaP, or Tdap).  Has seizures or another nervous system problem.  Has ever had Guillain-Barr Syndrome (also called GBS).  Has had severe pain or swelling after a previous dose of any vaccine that protects against tetanus or diphtheria. In some cases, your health care provider may decide to postpone Tdap vaccination to a future visit.  People with minor illnesses, such as a cold, may be vaccinated. People who are moderately or severely ill should usually wait until they recover before getting Tdap vaccine.  Your health care provider can give you more information. 4. Risks of a vaccine reaction  Pain, redness, or swelling where the shot was given, mild fever, headache, feeling tired, and nausea, vomiting, diarrhea, or stomachache sometimes happen after Tdap vaccine. People sometimes faint after medical procedures, including vaccination. Tell your provider if you feel dizzy or have vision changes or ringing in the ears.  As with any medicine, there is a very remote chance of a vaccine causing a severe allergic reaction, other serious injury, or death. 5. What if there is a serious problem? An allergic reaction could occur after the vaccinated person leaves the clinic. If you see signs of a severe allergic reaction (hives, swelling of the face and throat, difficulty breathing, a fast heartbeat, dizziness, or weakness), call 9-1-1 and get the person to the nearest hospital. For other signs that concern you, call your health care provider.  Adverse reactions should be reported to the Vaccine Adverse Event Reporting System (VAERS). Your health care provider will usually file this report, or you can do it yourself. Visit the VAERS website at  www.vaers.LAgents.nohhs.gov or call 36565868821-330-042-9394. VAERS is only for reporting reactions, and VAERS staff do not give medical advice. 6. The National Vaccine Injury Compensation Program The [employer] Vaccine Injury Compensation Program (VICP) is a federal program that was created to compensate people who may have been injured by certain vaccines. Visit the VICP website at SpiritualWord.atwww.hrsa.gov/vaccinecompensation or call 30264707251-8317755913 to learn about the program and about filing a claim. There is a time limit to file a claim for compensation. 7. How can I learn more?  Ask your health care provider.  Call your local or state health department.  Contact the Centers for Disease Control and Prevention (CDC): ? Call 979-393-8424 (1-800-CDC-INFO) or ? Visit CDC's website at PicCapture.uy Vaccine Information Statement Tdap (Tetanus, Diphtheria, Pertussis) Vaccine (06/17/2018) This information is not intended to replace advice given to you by your health care provider. Make sure you discuss any questions you have with your health care provider. Document Revised: 06/26/2018 Document Reviewed: 06/29/2018 Elsevier Patient Education  2020 [employer].

## 2019-10-07 NOTE — Progress Notes (Signed)
  [facility] Prenatal Visit  Lauren Douglas is a 29 y.o. G3P1011 at [redacted]w[redacted]d here for routine follow up. She is dated by early ultrasound([redacted]w[redacted]d [place]).  She reports no complaints and feels baby move especially when sleeping or drinks cold water. . She reports fetal movement. She denies vaginal bleeding, contractions, or loss of fluid. See flow sheet for details.  Vitals:   10/07/19 1506  BP: 108/68  Pulse: 100      A/P: Pregnancy at [redacted]w[redacted]d.  Doing well.   1. Routine prenatal care:  Marland Kitchen Dating reviewed, dating tab is correct . Fetal heart tones Appropriate 142 . Fundal height within expected range.  . Infant feeding choice: Breastfeeding initially and then bot after 3 months  . Contraception choice: Depo-Provera . Infant circumcision desired yes, before they leave the hospital   . The patient does not have a history of Cesarean delivery and no referral to [facility] is indicated . Influenza vaccine not administered as not influenza season.   . Tdap was given today. . 1 hour glucola, CBC, RPR, and HIV were not obtained today.  Previously obtained, negative   . Rh status was reviewed and patient does not need Rhogam.  Rhogam was not given today.  . Pregnancy medical home and PHQ-9 forms were done today and reviewed.   . Childbirth and education classes were offered. . Pregnancy education regarding benefits of breastfeeding, contraception, fetal growth, expected weight gain, and safe infant sleep were discussed.  . Preterm labor and fetal movement precautions reviewed.  2. Pregnancy issues include the following and were addressed as appropriate today:   Obesity Pre-pregnancy weight: 166lbs Current weight: 186lbs Weight gain: 20lbs Goal weight gain: 11-20lbs  H/o PreE Chart review showing a history of preeclampsia with previous pregnancy.  H&P from her previous pregnancy delivery showing pre-E with UPC of 1.73.  HELLP labs were within normal limits at  that time.    Patient will continue to take aspirin daily, blood pressure is normal today  Sickle cell trait Partner was previously tested and is negative  . Problem list and pregnancy box updated: Yes.   Patient will need to be scheduled in faculty OB clinic for her third trimester visit during next visit Follow up 2 weeks.

## 2019-10-25 ENCOUNTER — Other Ambulatory Visit: Payer: Self-pay

## 2019-10-25 ENCOUNTER — Ambulatory Visit (INDEPENDENT_AMBULATORY_CARE_PROVIDER_SITE_OTHER): Payer: Medicaid Other | Admitting: Family Medicine

## 2019-10-25 ENCOUNTER — Encounter: Payer: Self-pay | Admitting: Family Medicine

## 2019-10-25 VITALS — BP 108/60 | HR 104 | Wt 188.4 lb

## 2019-10-25 DIAGNOSIS — Z1159 Encounter for screening for other viral diseases: Secondary | ICD-10-CM | POA: Diagnosis not present

## 2019-10-25 DIAGNOSIS — Z3480 Encounter for supervision of other normal pregnancy, unspecified trimester: Secondary | ICD-10-CM

## 2019-10-25 NOTE — Progress Notes (Signed)
  [facility] Prenatal Visit  Lauren Douglas is a 29 y.o. G3P1011 at [redacted]w[redacted]d here for routine follow up. She is dated by early ultrasound ([redacted]w[redacted]d).  She reports no complaints.  She reports great fetal movement. She denies vaginal bleeding, contractions, or loss of fluid. Aware of MAU location. See flow sheet for details.  Requesting a letter to receive dental care and to remain out of work starting 8/28.  Works as a Clinical biochemist.  Vitals:   10/25/19 1536  BP: 108/60  Pulse: (!) 104  Fetal heart tone: 140 bpm Fundal height: ~31 cm   A/P: Pregnancy at [redacted]w[redacted]d.  Doing well.   1. Routine prenatal care: Will obtain hep C screening today . Dating reviewed, dating tab is correct . Fetal heart tones: Appropriate . Fundal height: within expected range.  . The patient does not have a history of HSV and valacyclovir is not indicated at this time.  . The patient does not have a history of Cesarean delivery and no referral to [facility] for [facility] is indicated . Infant feeding choice: Both  . Contraception choice: Depo-Provera . Infant circumcision desired yes, would like inpatient . Influenza vaccine not administered as not influenza season.   . Tdap has already been given . Childbirth and education classes were offered again today, declined.  . Preterm labor and fetal movement precautions reviewed.  2. Pregnancy issues include the following and were addressed as appropriate today:  Obesity: 26 lb weight gain for pregnancy thus far, reasonable with goal around 11-20  Sickle cell trait: Partner is not a carrier  History of pre-e without severe features in previous pregnancy, no concerning s/sx at this time and BP wnl   Continue ASA 81 mg daily  Problem list and pregnancy box updated: Yes.   Provided with work and dental note.  Follow up 2 weeks, already had an appointment scheduled with Dr. Nobie Putnam on 8/24.  Will need next follow-up appointment with [facility] faculty  clinic.  Allayne Stack, DO

## 2019-10-25 NOTE — Patient Instructions (Signed)
It was wonderful to see you today.  If you have any leakage of fluid, vaginal bleeding, severe contractions, decreased movement of baby, blood pressure consistently >140/90, please make sure you go over to the MAU.  Otherwise, we will see you for your follow-up visit on 8/24.

## 2019-10-26 LAB — HCV INTERPRETATION

## 2019-10-26 LAB — HCV AB W REFLEX TO QUANT PCR: HCV Ab: <0.1 {s_co_ratio} (ref 0.0–0.9)

## 2019-11-09 ENCOUNTER — Encounter: Payer: Self-pay | Admitting: Family Medicine

## 2019-11-09 ENCOUNTER — Other Ambulatory Visit: Payer: Self-pay

## 2019-11-09 ENCOUNTER — Ambulatory Visit (INDEPENDENT_AMBULATORY_CARE_PROVIDER_SITE_OTHER): Payer: Medicaid Other | Admitting: Family Medicine

## 2019-11-09 ENCOUNTER — Telehealth: Payer: Self-pay | Admitting: Family Medicine

## 2019-11-09 DIAGNOSIS — Z3483 Encounter for supervision of other normal pregnancy, third trimester: Secondary | ICD-10-CM

## 2019-11-09 NOTE — Telephone Encounter (Signed)
Placed forms in MD box to fill out. Pavle Wiler Bruna Potter, CMA

## 2019-11-09 NOTE — Progress Notes (Signed)
  [facility] Prenatal Visit  Lauren Douglas is a 29 y.o. G3P1011 at [redacted]w[redacted]d here for routine follow up. She is dated by LMP, early ultrasound.  She reports no bleeding, no contractions, no leaking and is having round ligament pain .  She reports fetal movement. She denies vaginal bleeding, contractions, or loss of fluid.  See flow sheet for details.  Vitals:   11/09/19 1543  BP: 112/72  Pulse: 98     A/P: Pregnancy at [redacted]w[redacted]d.  Doing well.   1. Routine prenatal care:  Marland Kitchen Dating reviewed, dating tab is correct . Fetal heart tones: Appropriate . Fundal height: within expected range.  . The patient does not have a history of HSV and valacyclovir is not indicated at this time.  . The patient does not have a history of Cesarean delivery and no referral to [facility] for [facility] is indicated . Infant feeding choice: Breastfeeding . Contraception choice: Depo-Provera . Infant circumcision desired yes . Influenza vaccine not administered as not influenza season.   . Tdap was not given today. Previously given.  . Childbirth and education classes were offered. . Pregnancy education regarding benefits of breastfeeding, contraception, fetal growth, expected weight gain, and safe infant sleep were discussed.  . Preterm labor and fetal movement precautions reviewed.   2. Pregnancy issues include the following and were addressed as appropriate today:  Obesity: 26 lb weight gain for pregnancy at previous visit but weight was not checked today due to concerns for her daughter being sick, reasonable with goal around 11-20.  Will need weight check at next visit.  Sickle cell trait: Partner is not a carrier  History of pre-e without severe features in previous pregnancy, no concerning s/sx at this time and BP wnl   Continue ASA 81 mg daily  . Problem list and pregnancy box updated: Yes.   Scheduled for Ob Faculty clinic in third trimester on 9/9 at 10:00 AM .   Follow up 2  weeks.

## 2019-11-09 NOTE — Patient Instructions (Signed)
It was wonderful to see you today but I am sorry you are having these issues with the round ligament pain.  Below is information on round ligament pain.  Everything looks great regarding the rest of your pregnancy and baby's heart rate is wonderful.  I have scheduled you for a routine prenatal visit with our faculty OB clinic on 9/9 at 10 AM.  After that visit you will be having weekly visits.  If you have any issues between now and then including but not limited to a gush of fluid, vaginal bleeding, regular increasing contractions, or decreased fetal movement please seek medical attention immediately.  I hope you have a wonderful afternoon!   Round Ligament Pain  The round ligament is a cord of muscle and tissue that helps support the uterus. It can become a source of pain during pregnancy if it becomes stretched or twisted as the baby grows. The pain usually begins in the second trimester (13-28 weeks) of pregnancy, and it can come and go until the baby is delivered. It is not a serious problem, and it does not cause harm to the baby. Round ligament pain is usually a short, sharp, and pinching pain, but it can also be a dull, lingering, and aching pain. The pain is felt in the lower side of the abdomen or in the groin. It usually starts deep in the groin and moves up to the outside of the hip area. The pain may occur when you:  Suddenly change position, such as quickly going from a sitting to standing position.  Roll over in bed.  Cough or sneeze.  Do physical activity. Follow these instructions at home:   Watch your condition for any changes.  When the pain starts, relax. Then try any of these methods to help with the pain: ? Sitting down. ? Flexing your knees up to your abdomen. ? Lying on your side with one pillow under your abdomen and another pillow between your legs. ? Sitting in a warm bath for 15-20 minutes or until the pain goes away.  Take over-the-counter and prescription  medicines only as told by your health care provider.  Move slowly when you sit down or stand up.  Avoid long walks if they cause pain.  Stop or reduce your physical activities if they cause pain.  Keep all follow-up visits as told by your health care provider. This is important. Contact a health care provider if:  Your pain does not go away with treatment.  You feel pain in your back that you did not have before.  Your medicine is not helping. Get help right away if:  You have a fever or chills.  You develop uterine contractions.  You have vaginal bleeding.  You have nausea or vomiting.  You have diarrhea.  You have pain when you urinate. Summary  Round ligament pain is felt in the lower abdomen or groin. It is usually a short, sharp, and pinching pain. It can also be a dull, lingering, and aching pain.  This pain usually begins in the second trimester (13-28 weeks). It occurs because the uterus is stretching with the growing baby, and it is not harmful to the baby.  You may notice the pain when you suddenly change position, when you cough or sneeze, or during physical activity.  Relaxing, flexing your knees to your abdomen, lying on one side, or taking a warm bath may help to get rid of the pain.  Get help from your health care  provider if the pain does not go away or if you have vaginal bleeding, nausea, vomiting, diarrhea, or painful urination. This information is not intended to replace advice given to you by your health care provider. Make sure you discuss any questions you have with your health care provider. Document Revised: 08/20/2017 Document Reviewed: 08/20/2017 Elsevier Patient Education  2020 [employer].

## 2019-11-09 NOTE — Telephone Encounter (Signed)
Patient is dropping off FMLA forms to be completed by doctor. Patient would like the forms to be faxed. Fax #: [phone]. Last DOS:10/25/2019 Patients contact: [phone]

## 2019-11-15 ENCOUNTER — Encounter: Payer: Self-pay | Admitting: Family Medicine

## 2019-11-15 NOTE — Telephone Encounter (Signed)
Reviewed, completed, and signed form.  Please note, long term paperwork (life insurance) was not completed. Additionally, It looks like short term disability paperwork will need to be completed (again) post delivery. FMLA completed in entirety. Letter also printed for patient.  Note routed to RN team inbasket and placed completed form in Clinic RN's office (wall pocket above desk).  Westley Chandler, MD

## 2019-11-16 NOTE — Telephone Encounter (Signed)
Faxed paperwork to provided fax number. Copy made and placed in batch scanning.   Veronda Prude, RN

## 2019-11-25 ENCOUNTER — Telehealth: Payer: Self-pay | Admitting: Family Medicine

## 2019-11-25 ENCOUNTER — Other Ambulatory Visit: Payer: Self-pay

## 2019-11-25 ENCOUNTER — Ambulatory Visit (INDEPENDENT_AMBULATORY_CARE_PROVIDER_SITE_OTHER): Payer: Medicaid Other | Admitting: Family Medicine

## 2019-11-25 ENCOUNTER — Other Ambulatory Visit (HOSPITAL_COMMUNITY)
Admission: RE | Admit: 2019-11-25 | Discharge: 2019-11-25 | Disposition: A | Discharge status: Home / Self Care | Payer: Medicaid Other | Source: Ambulatory Visit | Attending: Family Medicine | Admitting: Family Medicine

## 2019-11-25 VITALS — BP 112/72 | HR 94 | Wt 191.6 lb

## 2019-11-25 DIAGNOSIS — Z3483 Encounter for supervision of other normal pregnancy, third trimester: Secondary | ICD-10-CM

## 2019-11-25 DIAGNOSIS — Z23 Encounter for immunization: Secondary | ICD-10-CM

## 2019-11-25 NOTE — Telephone Encounter (Signed)
[facility] Program Exchange of Information form dropped off for at front desk for completion.  Verified that patient section of form has been completed.  Last DOS/WCC with PCP was 11/25/19.  Placed form in  red team folder to be completed by clinical staff.  Vilinda Blanks

## 2019-11-25 NOTE — Patient Instructions (Signed)

## 2019-11-25 NOTE — Progress Notes (Signed)
  [facility] Prenatal Visit  Lauren Douglas is a 29 y.o. G3P1011 at [redacted]w[redacted]d here for routine follow up. She is dated by early ultrasound.  She reports round ligament pain. She reports fetal movement. She denies vaginal bleeding, contractions, or loss of fluid. See flow sheet for details.  Vitals:   11/25/19 0942  BP: 112/72  Pulse: 94   A/P: Pregnancy at [redacted]w[redacted]d.  Doing well.   1. Routine prenatal care  . Dating reviewed, dating tab is correct  . Fetal heart tones Appropriate . Fundal height within expected range.  . Fetal position confirmed Vertex using Ultrasound .  Marland Kitchen GBS collected today. .  . Repeat GC/CT collected today.  . The patient does not have a history of HSV and valacyclovir is not indicated at this time.  . Infant feeding choice: Breastfeeding . Contraception choice: Depo-Provera . Infant circumcision desired yes . Influenza vaccine administered today.   . Tdap previously administered between 27-36 weeks  . COVID vaccination was discussed and patient is fully vaccinated.  . Pregnancy education regarding preterm labor, fetal movement,  benefits of breastfeeding, contraception, fetal growth, expected weight gain, and safe infant sleep were discussed.    2. Pregnancy issues include the following and were addressed as appropriate today:   Obesity: Wt. Today 191lb. Total of 29lbs gain.  Sickle cell trait: father not a carrier  Hx of pre-E w/o SF. BP wnl. Denies HA, vision changes, RUQ pain: ASA 81 mg endorses compliance . Problem list and pregnancy box updated: Yes.   Follow up 1 week.

## 2019-11-25 NOTE — Telephone Encounter (Signed)
Reviewed form and placed in PCP's box for completion.  .Khylee Algeo R Berniece Abid, CMA  

## 2019-11-26 LAB — CERVICOVAGINAL ANCILLARY ONLY
Chlamydia: NEGATIVE
Comment: NEGATIVE
Comment: NEGATIVE
Comment: NORMAL
Neisseria Gonorrhea: NEGATIVE
Trichomonas: NEGATIVE

## 2019-11-26 NOTE — Telephone Encounter (Signed)
Reviewed, completed, and signed form.  Note routed to RN team inbasket and placed completed form in Clinic RN's office (wall pocket above desk).  Bell Carbo M Yutaka Holberg, MD   

## 2019-11-29 LAB — CULTURE, BETA STREP (GROUP B ONLY): Strep Gp B Culture: NEGATIVE

## 2019-11-29 NOTE — Telephone Encounter (Signed)
Called patient to verify where to send paperwork.   WIC paperwork to be mailed to [facility] office. Placed in mail folder to be sent out. Copy made and placed in batch scanning.   Disability paperwork faxed to provided number. Copy made and placed in batch scanning.   Veronda Prude, RN

## 2019-12-03 ENCOUNTER — Other Ambulatory Visit: Payer: Self-pay

## 2019-12-03 ENCOUNTER — Ambulatory Visit (INDEPENDENT_AMBULATORY_CARE_PROVIDER_SITE_OTHER): Payer: Medicaid Other | Admitting: Family Medicine

## 2019-12-03 DIAGNOSIS — Z3483 Encounter for supervision of other normal pregnancy, third trimester: Secondary | ICD-10-CM

## 2019-12-03 NOTE — Patient Instructions (Addendum)
It was wonderful to see you today.  Today we talked about:  Signs of high blood pressure especially a headache that will not go away with tylenol.  Please call the clinic at [phone] if you have any concerns. It was our pleasure to serve you.  Dr. Salvadore Dom  Signs and Symptoms of Labor Labor is your body's natural process of moving your baby, placenta, and umbilical cord out of your uterus. The process of labor usually starts when your baby is full-term, between 100 and 40 weeks of pregnancy. How will I know when I am close to going into labor? As your body prepares for labor and the birth of your baby, you may notice the following symptoms in the weeks and days before true labor starts:  Having a strong desire to get your home ready to receive your new baby. This is called nesting. Nesting may be a sign that labor is approaching, and it may occur several weeks before birth. Nesting may involve cleaning and organizing your home.  Passing a small amount of thick, bloody mucus out of your vagina (normal bloody show or losing your mucus plug). This may happen more than a week before labor begins, or it might occur right before labor begins as the opening of the cervix starts to widen (dilate). For some women, the entire mucus plug passes at once. For others, smaller portions of the mucus plug may gradually pass over several days.  Your baby moving (dropping) lower in your pelvis to get into position for birth (lightening). When this happens, you may feel more pressure on your bladder and pelvic bone and less pressure on your ribs. This may make it easier to breathe. It may also cause you to need to urinate more often and have problems with bowel movements.  Having "practice contractions" (Braxton Hicks contractions) that occur at irregular (unevenly spaced) intervals that are more than 10 minutes apart. This is also called false labor. False labor contractions are common after exercise or  sexual activity, and they will stop if you change position, rest, or drink fluids. These contractions are usually mild and do not get stronger over time. They may feel like: ? A backache or back pain. ? Mild cramps, similar to menstrual cramps. ? Tightening or pressure in your abdomen. Other early symptoms that labor may be starting soon include:  Nausea or loss of appetite.  Diarrhea.  Having a sudden burst of energy, or feeling very tired.  Mood changes.  Having trouble sleeping. How will I know when labor has begun? Signs that true labor has begun may include:  Having contractions that come at regular (evenly spaced) intervals and increase in intensity. This may feel like more intense tightening or pressure in your abdomen that moves to your back. ? Contractions may also feel like rhythmic pain in your upper thighs or back that comes and goes at regular intervals. ? For first-time mothers, this change in intensity of contractions often occurs at a more gradual pace. ? Women who have given birth before may notice a more rapid progression of contraction changes.  Having a feeling of pressure in the vaginal area.  Your water breaking (rupture of membranes). This is when the sac of fluid that surrounds your baby breaks. When this happens, you will notice fluid leaking from your vagina. This may be clear or blood-tinged. Labor usually starts within 24 hours of your water breaking, but it may take longer to begin. ? Some women notice this as  a gush of fluid. ? Others notice that their underwear repeatedly becomes damp. Follow these instructions at home:   When labor starts, or if your water breaks, call your health care provider or nurse care line. Based on your situation, they will determine when you should go in for an exam.  When you are in early labor, you may be able to rest and manage symptoms at home. Some strategies to try at home include: ? Breathing and relaxation  techniques. ? Taking a warm bath or shower. ? Listening to music. ? Using a heating pad on the lower back for pain. If you are directed to use heat:  Place a towel between your skin and the heat source.  Leave the heat on for 20-30 minutes.  Remove the heat if your skin turns bright red. This is especially important if you are unable to feel pain, heat, or cold. You may have a greater risk of getting burned. Get help right away if:  You have painful, regular contractions that are 5 minutes apart or less.  Labor starts before you are [redacted] weeks along in your pregnancy.  You have a fever.  You have a headache that does not go away.  You have bright red blood coming from your vagina.  You do not feel your baby moving.  You have a sudden onset of: ? Severe headache with vision problems. ? Nausea, vomiting, or diarrhea. ? Chest pain or shortness of breath. These symptoms may be an emergency. If your health care provider recommends that you go to the hospital or birth center where you plan to deliver, do not drive yourself. Have someone else drive you, or call emergency services (911 in the [place] Summary  Labor is your body's natural process of moving your baby, placenta, and umbilical cord out of your uterus.  The process of labor usually starts when your baby is full-term, between 93 and 40 weeks of pregnancy.  When labor starts, or if your water breaks, call your health care provider or nurse care line. Based on your situation, they will determine when you should go in for an exam. This information is not intended to replace advice given to you by your health care provider. Make sure you discuss any questions you have with your health care provider. Document Revised: 12/02/2016 Document Reviewed: 08/09/2016 Elsevier Patient Education  2020 [employer].

## 2019-12-03 NOTE — Progress Notes (Signed)
  [facility] Prenatal Visit  Marye Eagen is a 29 y.o. G3P1011 at [redacted]w[redacted]d here for routine follow up. She is dated by early ultrasound.  She reports no complaints. She reports fetal movement. She denies vaginal bleeding, contractions, or loss of fluid. See flow sheet for details.  Vitals:   12/03/19 1458  BP: 110/70  Pulse: 95   A/P: Pregnancy at [redacted]w[redacted]d.  Doing well.   1. Routine prenatal care  . Dating reviewed, dating tab is correct . Fetal heart tones Appropriate . Fundal height within expected range.  . Fetal position confirmed Vertex using Leopold's .  Marland Kitchen GBS not collected today due to previous collection. .  . Repeat GC/CT not collected today due to previous collection.  . The patient does not have a history of HSV and valacyclovir is not indicated at this time.  . Infant feeding choice: Breastfeeding . Contraception choice: Depo-Provera . Infant circumcision desired yes . Influenza vaccine previously administered.   . Tdap previously administered between 27-36 weeks  . COVID vaccination completed.  . Pregnancy education regarding preterm labor, fetal movement,  benefits of breastfeeding, contraception, fetal growth, expected weight gain, and safe infant sleep were discussed.    2. Pregnancy issues include the following and were addressed as appropriate today:  . Hx of Pre-E w/ SF:   -taking 81 ASA  -normal BP today  -symptom and return precautions given  . Problem list and pregnancy box updated: Yes.  Follow up 1 week.

## 2019-12-10 ENCOUNTER — Ambulatory Visit (INDEPENDENT_AMBULATORY_CARE_PROVIDER_SITE_OTHER): Payer: Medicaid Other | Admitting: Family Medicine

## 2019-12-10 ENCOUNTER — Other Ambulatory Visit: Payer: Self-pay

## 2019-12-10 DIAGNOSIS — Z3483 Encounter for supervision of other normal pregnancy, third trimester: Secondary | ICD-10-CM

## 2019-12-10 NOTE — Progress Notes (Signed)
  [facility] Prenatal Visit  Kiandria Clum is a 29 y.o. G3P1011 at [redacted]w[redacted]d here for routine follow up. She is dated by early ultrasound.  She reports diarrhea x3/day that started tuesday. She reports fetal movement. She denies vaginal bleeding, contractions, or loss of fluid. See flow sheet for details.  Vitals:   12/10/19 1407  BP: 118/66  Pulse: 89    A/P: Pregnancy at [redacted]w[redacted]d.  Doing well.   1. Routine prenatal care:  Marland Kitchen Dating reviewed, dating tab is correct . Fetal heart tones Appropriate . Fundal height within expected range.  . Fetal position confirmed Vertex using Leopold's .  Marland Kitchen Infant feeding choice: Breastfeeding . Contraception choice: Depo-Provera . Infant circumcision desired yes . Pain control in labor discussed and patient desires natural birth.  . Influenza vaccine previously administered.   . Tdap previously administered between 27-36 weeks  . GBS and gc/chlamydia testing results were reviewed today.   . Pregnancy education regarding labor, fetal movement,  benefits of breastfeeding, contraception, and safe infant sleep were discussed.  . Labor and fetal movement precautions reviewed. . Induction of labor discussed.  . Needs to be scheduled for induction at approximately 41 weeks.  . BPP ordered for when patient is between 40-41 weeks; Needs to be scheduled next week. Office called and closed today.  2. Pregnancy issues include the following and were addressed as appropriate today:   Supervision of low risk subsequent pregnancy  -PNV  -Labor signs and when to got to MAU discussed  . Hx of gHTN preE w/o SF  -ASA 81 mg daily    Diarrhea  -Possible sign of labor  -Follow up if this worsens or affects your fluid intake.  . Problem list and pregnancy box updated: Yes.   Follow up 1 week.

## 2019-12-10 NOTE — Patient Instructions (Addendum)
It was wonderful to see you today.  Please bring ALL of your medications with you to every visit.   Today we talked about:  Having diarrhea and this being a sign of labor. We discussed calling if contractions are sustained and if your water breaks to head to the maternity unit.   We will schedule you for a Biophysical profile when you are 40 weeks (testing to make sure baby is still doing well) on Monday and call you with the date and time.   Please call the clinic at [phone] if your symptoms worsen or you have any concerns. It was our pleasure to serve you.  Dr. Salvadore Dom    Third Trimester of Pregnancy  The third trimester is from week 28 through week 40 (months 7 through 9). This trimester is when your unborn baby (fetus) is growing very fast. At the end of the ninth month, the unborn baby is about 20 inches in length. It weighs about 6-10 pounds. Follow these instructions at home: Medicines  Take over-the-counter and prescription medicines only as told by your doctor. Some medicines are safe and some medicines are not safe during pregnancy.  Take a prenatal vitamin that contains at least 600 micrograms (mcg) of folic acid.  If you have trouble pooping (constipation), take medicine that will make your stool soft (stool softener) if your doctor approves. Eating and drinking   Eat regular, healthy meals.  Avoid raw meat and uncooked cheese.  If you get low calcium from the food you eat, talk to your doctor about taking a daily calcium supplement.  Eat four or five small meals rather than three large meals a day.  Avoid foods that are high in fat and sugars, such as fried and sweet foods.  To prevent constipation: ? Eat foods that are high in fiber, like fresh fruits and vegetables, whole grains, and beans. ? Drink enough fluids to keep your pee (urine) clear or pale yellow. Activity  Exercise only as told by your doctor. Stop exercising if you start to have  cramps.  Avoid heavy lifting, wear low heels, and sit up straight.  Do not exercise if it is too hot, too humid, or if you are in a place of great height (high altitude).  You may continue to have sex unless your doctor tells you not to. Relieving pain and discomfort  Wear a good support bra if your breasts are tender.  Take frequent breaks and rest with your legs raised if you have leg cramps or low back pain.  Take warm water baths (sitz baths) to soothe pain or discomfort caused by hemorrhoids. Use hemorrhoid cream if your doctor approves.  If you develop puffy, bulging veins (varicose veins) in your legs: ? Wear support hose or compression stockings as told by your doctor. ? Raise (elevate) your feet for 15 minutes, 3-4 times a day. ? Limit salt in your food. Safety  Wear your seat belt when driving.  Make a list of emergency phone numbers, including numbers for family, friends, the hospital, and police and fire departments. Preparing for your baby's arrival To prepare for the arrival of your baby:  Take prenatal classes.  Practice driving to the hospital.  Visit the hospital and tour the maternity area.  Talk to your work about taking leave once the baby comes.  Pack your hospital bag.  Prepare the baby's room.  Go to your doctor visits.  Buy a rear-facing car seat. Learn how to install it in  your car. General instructions  Do not use hot tubs, steam rooms, or saunas.  Do not use any products that contain nicotine or tobacco, such as cigarettes and e-cigarettes. If you need help quitting, ask your doctor.  Do not drink alcohol.  Do not douche or use tampons or scented sanitary pads.  Do not cross your legs for long periods of time.  Do not travel for long distances unless you must. Only do so if your doctor says it is okay.  Visit your dentist if you have not gone during your pregnancy. Use a soft toothbrush to brush your teeth. Be gentle when you  floss.  Avoid cat litter boxes and soil used by cats. These carry germs that can cause birth defects in the baby and can cause a loss of your baby (miscarriage) or stillbirth.  Keep all your prenatal visits as told by your doctor. This is important. Contact a doctor if:  You are not sure if you are in labor or if your water has broken.  You are dizzy.  You have mild cramps or pressure in your lower belly.  You have a nagging pain in your belly area.  You continue to feel sick to your stomach, you throw up, or you have watery poop.  You have bad smelling fluid coming from your vagina.  You have pain when you pee. Get help right away if:  You have a fever.  You are leaking fluid from your vagina.  You are spotting or bleeding from your vagina.  You have severe belly cramps or pain.  You lose or gain weight quickly.  You have trouble catching your breath and have chest pain.  You notice sudden or extreme puffiness (swelling) of your face, hands, ankles, feet, or legs.  You have not felt the baby move in over an hour.  You have severe headaches that do not go away with medicine.  You have trouble seeing.  You are leaking, or you are having a gush of fluid, from your vagina before you are 37 weeks.  You have regular belly spasms (contractions) before you are 37 weeks. Summary  The third trimester is from week 28 through week 40 (months 7 through 9). This time is when your unborn baby is growing very fast.  Follow your doctor's advice about medicine, food, and activity.  Get ready for the arrival of your baby by taking prenatal classes, getting all the baby items ready, preparing the baby's room, and visiting your doctor to be checked.  Get help right away if you are bleeding from your vagina, or you have chest pain and trouble catching your breath, or if you have not felt your baby move in over an hour. This information is not intended to replace advice given to you  by your health care provider. Make sure you discuss any questions you have with your health care provider. Document Revised: 06/25/2018 Document Reviewed: 04/09/2016 Elsevier Patient Education  2020 [employer].

## 2019-12-12 ENCOUNTER — Encounter: Payer: Self-pay | Admitting: Family Medicine

## 2019-12-13 NOTE — Telephone Encounter (Signed)
Called patient to discuss myChart message. Patient reports vaginal discharge with mucus for the last two days. Denies bleeding, contractions or gush of fluids. From description, appears discharge could be related to loss of mucus plug.   Reports that she feels that baby has dropped and is feeling some pressure with change in positions.   Advised of labor/ MAU precautions.   To PCP  Veronda Prude, RN

## 2019-12-14 ENCOUNTER — Inpatient Hospital Stay (HOSPITAL_COMMUNITY)
Admission: AD | Admit: 2019-12-14 | Discharge: 2019-12-16 | DRG: 807 | Disposition: A | Discharge status: Home / Self Care | Payer: Medicaid Other | Attending: Family Medicine | Admitting: Family Medicine

## 2019-12-14 ENCOUNTER — Encounter (HOSPITAL_COMMUNITY): Payer: Self-pay | Admitting: Obstetrics and Gynecology

## 2019-12-14 ENCOUNTER — Other Ambulatory Visit: Payer: Self-pay

## 2019-12-14 DIAGNOSIS — O368131 Decreased fetal movements, third trimester, fetus 1: Secondary | ICD-10-CM | POA: Diagnosis not present

## 2019-12-14 DIAGNOSIS — Z3A38 38 weeks gestation of pregnancy: Secondary | ICD-10-CM | POA: Diagnosis not present

## 2019-12-14 DIAGNOSIS — O322XX0 Maternal care for transverse and oblique lie, not applicable or unspecified: Secondary | ICD-10-CM | POA: Diagnosis present

## 2019-12-14 DIAGNOSIS — Z20822 Contact with and (suspected) exposure to covid-19: Secondary | ICD-10-CM | POA: Diagnosis present

## 2019-12-14 DIAGNOSIS — O329XX0 Maternal care for malpresentation of fetus, unspecified, not applicable or unspecified: Secondary | ICD-10-CM

## 2019-12-14 DIAGNOSIS — O326XX0 Maternal care for compound presentation, not applicable or unspecified: Secondary | ICD-10-CM | POA: Diagnosis not present

## 2019-12-14 DIAGNOSIS — O368130 Decreased fetal movements, third trimester, not applicable or unspecified: Principal | ICD-10-CM | POA: Diagnosis present

## 2019-12-14 DIAGNOSIS — D573 Sickle-cell trait: Secondary | ICD-10-CM | POA: Diagnosis present

## 2019-12-14 DIAGNOSIS — O4202 Full-term premature rupture of membranes, onset of labor within 24 hours of rupture: Secondary | ICD-10-CM | POA: Diagnosis not present

## 2019-12-14 DIAGNOSIS — Z3493 Encounter for supervision of normal pregnancy, unspecified, third trimester: Secondary | ICD-10-CM

## 2019-12-14 DIAGNOSIS — Z7982 Long term (current) use of aspirin: Secondary | ICD-10-CM

## 2019-12-14 DIAGNOSIS — Z3483 Encounter for supervision of other normal pregnancy, third trimester: Secondary | ICD-10-CM

## 2019-12-14 DIAGNOSIS — R87619 Unspecified abnormal cytological findings in specimens from cervix uteri: Secondary | ICD-10-CM | POA: Diagnosis present

## 2019-12-14 DIAGNOSIS — O9902 Anemia complicating childbirth: Secondary | ICD-10-CM | POA: Diagnosis not present

## 2019-12-14 HISTORY — DX: Gestational (pregnancy-induced) hypertension without significant proteinuria, unspecified trimester: O13.9

## 2019-12-14 LAB — CBC
HCT: 51.2 % — ABNORMAL HIGH (ref 36.0–46.0)
Hemoglobin: 17 g/dL — ABNORMAL HIGH (ref 12.0–15.0)
MCH: 28 pg (ref 26.0–34.0)
MCHC: 33.2 g/dL (ref 30.0–36.0)
MCV: 84.3 fL (ref 80.0–100.0)
Platelets: 221 10*3/uL (ref 150–400)
RBC: 6.07 MIL/uL — ABNORMAL HIGH (ref 3.87–5.11)
RDW: 14.9 % (ref 11.5–15.5)
WBC: 6.5 10*3/uL (ref 4.0–10.5)
nRBC: 0 % (ref 0.0–0.2)

## 2019-12-14 LAB — TYPE AND SCREEN
ABO/RH(D): B POS
Antibody Screen: NEGATIVE

## 2019-12-14 LAB — RESPIRATORY PANEL BY RT PCR (FLU A&B, COVID)
Influenza A by PCR: NEGATIVE
Influenza B by PCR: NEGATIVE
SARS Coronavirus 2 by RT PCR: NEGATIVE

## 2019-12-14 LAB — RPR: RPR Ser Ql: NONREACTIVE

## 2019-12-14 LAB — POCT FERN TEST: POCT Fern Test: POSITIVE

## 2019-12-14 MED ORDER — ZOLPIDEM TARTRATE 5 MG PO TABS: 5.0000 mg | ORAL_TABLET | Freq: Every evening | ORAL | Status: DC | PRN

## 2019-12-14 MED ORDER — IBUPROFEN 600 MG PO TABS
600.0000 mg | ORAL_TABLET | Freq: Four times a day (QID) | ORAL | Status: DC
  Administered 2019-12-14 – 2019-12-16 (×6): 600 mg via ORAL
  Filled 2019-12-14 (×6): qty 1

## 2019-12-14 MED ORDER — DIBUCAINE (PERIANAL) 1 % EX OINT: 1.0000 "application " | TOPICAL_OINTMENT | CUTANEOUS | Status: DC | PRN

## 2019-12-14 MED ORDER — ACETAMINOPHEN 325 MG PO TABS: 650.0000 mg | ORAL_TABLET | ORAL | Status: DC | PRN

## 2019-12-14 MED ORDER — MEDROXYPROGESTERONE ACETATE 150 MG/ML IM SUSP: 150.0000 mg | Freq: Once | INTRAMUSCULAR | Status: DC

## 2019-12-14 MED ORDER — OXYCODONE-ACETAMINOPHEN 5-325 MG PO TABS: 1.0000 | ORAL_TABLET | ORAL | Status: DC | PRN

## 2019-12-14 MED ORDER — SENNOSIDES-DOCUSATE SODIUM 8.6-50 MG PO TABS
2.0000 | ORAL_TABLET | ORAL | Status: DC
  Administered 2019-12-14: 2 via ORAL
  Filled 2019-12-14: qty 2

## 2019-12-14 MED ORDER — BENZOCAINE-MENTHOL 20-0.5 % EX AERO
1.0000 "application " | INHALATION_SPRAY | CUTANEOUS | Status: DC | PRN
  Administered 2019-12-14: 1 via TOPICAL
  Filled 2019-12-14: qty 56

## 2019-12-14 MED ORDER — TERBUTALINE SULFATE 1 MG/ML IJ SOLN: 0.2500 mg | Freq: Once | INTRAMUSCULAR | Status: DC | PRN

## 2019-12-14 MED ORDER — ONDANSETRON HCL 4 MG PO TABS: 4.0000 mg | ORAL_TABLET | ORAL | Status: DC | PRN

## 2019-12-14 MED ORDER — OXYTOCIN-SODIUM CHLORIDE 30-0.9 UT/500ML-% IV SOLN
1.0000 m[IU]/min | INTRAVENOUS | Status: DC
  Administered 2019-12-14: 2 m[IU]/min via INTRAVENOUS

## 2019-12-14 MED ORDER — LACTATED RINGERS IV SOLN: 500.0000 mL | INTRAVENOUS | Status: DC | PRN

## 2019-12-14 MED ORDER — OXYCODONE-ACETAMINOPHEN 5-325 MG PO TABS: 2.0000 | ORAL_TABLET | ORAL | Status: DC | PRN

## 2019-12-14 MED ORDER — SOD CITRATE-CITRIC ACID 500-334 MG/5ML PO SOLN: 30.0000 mL | ORAL | Status: DC | PRN

## 2019-12-14 MED ORDER — WITCH HAZEL-GLYCERIN EX PADS: 1.0000 "application " | MEDICATED_PAD | CUTANEOUS | Status: DC | PRN

## 2019-12-14 MED ORDER — COCONUT OIL OIL
1.0000 "application " | TOPICAL_OIL | Status: DC | PRN
  Administered 2019-12-14: 1 via TOPICAL

## 2019-12-14 MED ORDER — PRENATAL MULTIVITAMIN CH
1.0000 | ORAL_TABLET | Freq: Every day | ORAL | Status: DC
  Administered 2019-12-15: 1 via ORAL
  Filled 2019-12-14: qty 1

## 2019-12-14 MED ORDER — ONDANSETRON HCL 4 MG/2ML IJ SOLN: 4.0000 mg | INTRAMUSCULAR | Status: DC | PRN

## 2019-12-14 MED ORDER — ONDANSETRON HCL 4 MG/2ML IJ SOLN: 4.0000 mg | Freq: Four times a day (QID) | INTRAMUSCULAR | Status: DC | PRN

## 2019-12-14 MED ORDER — OXYTOCIN BOLUS FROM INFUSION
333.0000 mL | Freq: Once | INTRAVENOUS | Status: AC
  Administered 2019-12-14: 333 mL via INTRAVENOUS

## 2019-12-14 MED ORDER — DIPHENHYDRAMINE HCL 25 MG PO CAPS: 25.0000 mg | ORAL_CAPSULE | Freq: Four times a day (QID) | ORAL | Status: DC | PRN

## 2019-12-14 MED ORDER — TETANUS-DIPHTH-ACELL PERTUSSIS 5-2.5-18.5 LF-MCG/0.5 IM SUSP: 0.5000 mL | Freq: Once | INTRAMUSCULAR | Status: DC

## 2019-12-14 MED ORDER — OXYTOCIN-SODIUM CHLORIDE 30-0.9 UT/500ML-% IV SOLN
2.5000 [IU]/h | INTRAVENOUS | Status: DC
  Administered 2019-12-14: 2.5 [IU]/h via INTRAVENOUS
  Filled 2019-12-14: qty 500

## 2019-12-14 MED ORDER — LACTATED RINGERS IV SOLN: INTRAVENOUS | Status: DC

## 2019-12-14 MED ORDER — LIDOCAINE HCL (PF) 1 % IJ SOLN: 30.0000 mL | INTRAMUSCULAR | Status: DC | PRN

## 2019-12-14 MED ORDER — SIMETHICONE 80 MG PO CHEW: 80.0000 mg | CHEWABLE_TABLET | ORAL | Status: DC | PRN

## 2019-12-14 NOTE — MAU Note (Addendum)
Noted a small amt of blood this morning, denies recent exam.  Has had a little pain in lower abd and lower back, "like the crampy feeling when you need to poo poo"  Has had loose stools this morning.. small amt water noted in underwear..  Denies placental concerns on [place].  Has not felt movement since last night.

## 2019-12-14 NOTE — Progress Notes (Signed)
LABOR PROGRESS NOTE  Lauren Douglas is a 29 y.o. G3P1011 at [redacted]w[redacted]d  admitted for SOL/SROM and decreased fetal movement.   Subjective: Feeling contractions intensely.   Objective: BP 129/86   Pulse 92   Temp 98.4 F (36.9 C) (Oral)   Resp 18   Ht 5' (1.524 m)   Wt 92.1 kg   LMP 03/25/2019 (Exact Date)   SpO2 99%   BMI 39.67 kg/m  or  Vitals:   12/14/19 1333 12/14/19 1401 12/14/19 1433 12/14/19 1509  BP: 123/82 133/86 127/82 129/86  Pulse: 85 77 87 92  Resp: 18 18 18 18   Temp:  98.4 F (36.9 C)    TempSrc:  Oral    SpO2:      Weight:      Height:        Dilation: 4 Effacement (%): 80 Cervical Position: Posterior Station: -2 Presentation: Vertex Exam by:: Britain Saber  Forebag present.  FHT: baseline rate 150, moderate varibility, +acel, -decel Toco: Every 2 minutes   Labs: Lab Results  Component Value Date   WBC 6.5 12/14/2019   HGB 17.0 (H) 12/14/2019   HCT 51.2 (H) 12/14/2019   MCV 84.3 12/14/2019   PLT 221 12/14/2019    Patient Active Problem List   Diagnosis Date Noted  . Indication for care in labor and delivery, antepartum 12/14/2019  . Supervision of normal pregnancy in third trimester 05/26/2019  . Abnormal Pap smear of cervix 09/10/2018  . Sickle cell trait (HCC) 03/13/2016  . GERD (gastroesophageal reflux disease) 03/05/2014    Assessment / Plan: 29 y.o. G3P1011 at [redacted]w[redacted]d here for SOL/SROM.   Labor: Progressing well with frequent contractions. Will cont pit as is, can consider additional AROM of forebag on next check.  Fetal Wellbeing:  Cat 1 strip  Pain Control:  Desires no medications  Anticipated MOD:  SVD  [redacted]w[redacted]d, DO Family Medicine PGY-3  12/14/2019, 5:15 PM

## 2019-12-14 NOTE — Discharge Summary (Signed)
Postpartum Discharge Summary      Patient Name: Lauren Douglas DOB: Aug 31, 1990 MRN: [id number]  Date of admission: 12/14/2019 Delivery date:12/14/2019  Delivering provider: Patriciaann Clan  Date of discharge: 12/16/2019  Admitting diagnosis: Indication for care in labor and delivery, antepartum [O75.9] Intrauterine pregnancy: [redacted]w[redacted]d     Secondary diagnosis:  Active Problems:   Sickle cell trait (HCC)   Vaginal delivery   Abnormal Pap smear of cervix   Supervision of normal pregnancy in third trimester   Indication for care in labor and delivery, antepartum  Additional problems: none    Discharge diagnosis: Term Pregnancy Delivered                                              Post partum procedures:none Augmentation: Pitocin Complications: None  Hospital course: Onset of Labor With Vaginal Delivery      29 y.o. yo G3P1011 at [redacted]w[redacted]d was admitted in Latent Labor on 12/14/2019. Patient had an uncomplicated labor course as follows:  Membrane Rupture Time/Date: 6:00 AM ,12/14/2019   Delivery Method:Vaginal, Spontaneous  Episiotomy: None  Lacerations:  None  Patient had an uncomplicated postpartum course.  She is ambulating, tolerating a regular diet, passing flatus, and urinating well. Patient is discharged home in stable condition on 12/16/19.  Newborn Data: Birth date:12/14/2019  Birth time:7:12 PM  Gender:Female  Living status:Living  Apgars:8 ,9  Weight:3260 g   Magnesium Sulfate received: No BMZ received: No Rhophylac:N/A MMR:N/A T-DaP:Given prenatally Flu: No Transfusion:No  Physical exam  Vitals:   12/15/19 0635 12/15/19 1507 12/15/19 1930 12/16/19 0534  BP: 124/74 118/83 126/77 118/79  Pulse: 85 80 78 93  Resp: $Remo'18 16 15 18  'DzraM$ Temp: 98.1 F (36.7 C) 98.4 F (36.9 C) 98 F (36.7 C) 99.1 F (37.3 C)  TempSrc: Oral Oral Oral Oral  SpO2:  99% 99% 100%  Weight:      Height:       General: alert and cooperative Lochia: appropriate Uterine Fundus:  firm Incision: N/A DVT Evaluation: No evidence of DVT seen on physical exam. Labs: Lab Results  Component Value Date   WBC 6.5 12/14/2019   HGB 17.0 (H) 12/14/2019   HCT 51.2 (H) 12/14/2019   MCV 84.3 12/14/2019   PLT 221 12/14/2019   CMP Latest Ref Rng & Units 12/02/2016  Glucose 65 - 99 mg/dL 89  BUN 6 - 20 mg/dL 10  Creatinine 0.57 - 1.00 mg/dL 1.01(H)  Sodium 134 - 144 mmol/L 139  Potassium 3.5 - 5.2 mmol/L 4.3  Chloride 96 - 106 mmol/L 103  CO2 20 - 29 mmol/L 23  Calcium 8.7 - 10.2 mg/dL 9.2  Total Protein 6.0 - 8.5 g/dL 7.6  Total Bilirubin 0.0 - 1.2 mg/dL 0.2  Alkaline Phos 39 - 117 IU/L 106  AST 0 - 40 IU/L 23  ALT 0 - 32 IU/L 20   Edinburgh Score: Edinburgh Postnatal Depression Scale Screening Tool 12/14/2019  I have been able to laugh and see the funny side of things. 0  I have looked forward with enjoyment to things. 0  I have blamed myself unnecessarily when things went wrong. 0  I have been anxious or worried for no good reason. 0  I have felt scared or panicky for no good reason. 0  Things have been getting on top of me. 0  I have  been so unhappy that I have had difficulty sleeping. 0  I have felt sad or miserable. 0  I have been so unhappy that I have been crying. 0  The thought of harming myself has occurred to me. 0  Edinburgh Postnatal Depression Scale Total 0     After visit meds:  Allergies as of 12/16/2019   No Known Allergies     Medication List    STOP taking these medications   aspirin EC 81 MG tablet   TYLENOL PO     TAKE these medications   ibuprofen 600 MG tablet Commonly known as: ADVIL Take 1 tablet (600 mg total) by mouth every 6 (six) hours as needed.   Prenatal Vitamin 27-0.8 MG Tabs Take 1 tablet by mouth daily.        Discharge home in stable condition Infant Feeding: Breast Infant Disposition:home with mother Discharge instruction: per After Visit Summary and Postpartum booklet. Activity: Advance as tolerated.  Pelvic rest for 6 weeks.  Diet: routine diet Future Appointments: Future Appointments  Date Time Provider [place]  12/17/2019  9:10 AM Matilde Haymaker, MD [facility]  01/14/2020 11:10 AM Martyn Malay, MD [facility]   Follow up Visit:   Please schedule this patient for a In person postpartum visit in 4 weeks with the following provider: Any provider. Additional Postpartum F/U:none  Low risk pregnancy complicated by: n/a Delivery mode:  Vaginal, Spontaneous  Anticipated Birth Control:  Depo (outpt)   12/16/2019 Myrtis Ser, [name]  7:43 AM

## 2019-12-14 NOTE — Progress Notes (Signed)
Vtx confirmed by [facility].  

## 2019-12-14 NOTE — H&P (Addendum)
OBSTETRIC ADMISSION HISTORY AND PHYSICAL  Lauren Douglas is a 29 y.o. female G3P1011 with IUP at [redacted]w[redacted]d by 11 week U/S presenting for decreased fetal movement since last night and SROM (9/28 @0600 ). Fern test positive in MAU. She reports some vaginal bleeding (none noted on SVE in MAU), no blurry vision, headaches or peripheral edema, and RUQ pain.  She plans on breast feeding. She request depo for birth control.  She desires as minimal interventions as possible for induction/augmentation.   She received her prenatal care at [facility]   Dating: By 11 week U/S--->  Estimated Date of Delivery: 12/23/19  Sono:    @[redacted]w[redacted]d , CWD, normal anatomy, cephalic presentation, 618g, 33% EFW   Prenatal History/Complications:  --Sickle cell trait (FOB unk) --Previous history of gestational hypertension/pre-eclampsia in previous pregnancy (taking bASA)  Past Medical History: Past Medical History:  Diagnosis Date  . Depression 03/05/2014   denies  . Gestational hypertension 10/22/2016  . Pregnancy induced hypertension    at end of first preg    Past Surgical History: Past Surgical History:  Procedure Laterality Date  . NO PAST SURGERIES      Obstetrical History: OB History    Gravida  3   Para  1   Term  1   Preterm  0   AB  1   Living  1     SAB  0   TAB  0   Ectopic  0   Multiple  0   Live Births  1           Social History Social History   Socioeconomic History  . Marital status: Married    Spouse name: Not on file  . Number of children: Not on file  . Years of education: Not on file  . Highest education level: Not on file  Occupational History  . 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 Smoking status: Never Smoker  . Smokeless tobacco: Never Used  Vaping Use  . Vaping Use: Never used  Substance and Sexual Activity  . Alcohol use: No    Alcohol/week: 0.0 standard drinks  . Drug use: No  . Sexual activity: Yes  Other Topics Concern  . Not on file   Social History Narrative   Lives with her husband 12/22/2016)    Social Determinants of Health   Financial Resource Strain:   . Difficulty of Paying Living Expenses: Not on file  Food Insecurity:   . Worried About Engineer, maintenance in the Last Year: Not on file  . Ran Out of Food in the Last Year: Not on file  Transportation Needs:   . Lack of Transportation (Medical): Not on file  . Lack of Transportation (Non-Medical): Not on file  Physical Activity:   . Days of Exercise per Week: Not on file  . Minutes of Exercise per Session: Not on file  Stress:   . Feeling of Stress : Not on file  Social Connections:   . Frequency of Communication with Friends and Family: Not on file  . Frequency of Social Gatherings with Friends and Family: Not on file  . Attends Religious Services: Not on file  . Active Member of Clubs or Organizations: Not on file  . Attends TEFL teacher Meetings: Not on file  . Marital Status: Not on file    Family History: Family History  Problem Relation Age of Onset  . Healthy Mother   . Healthy Father   . Diabetes  Neg Hx   . Hypertension Neg Hx     Allergies: No Known Allergies  Medications Prior to Admission  Medication Sig Dispense Refill Last Dose  . aspirin EC 81 MG tablet Take 1 tablet (81 mg total) by mouth daily. 30 tablet 4 12/13/2019 at Unknown time  . Prenatal Vit-Fe Fumarate-FA (PRENATAL VITAMIN) 27-0.8 MG TABS Take 1 tablet by mouth daily. 90 tablet 3 12/13/2019 at Unknown time  . Acetaminophen (TYLENOL PO) Take by mouth.   More than a month at Unknown time     Review of Systems   All systems reviewed and negative except as stated in HPI  Blood pressure 128/86, pulse 78, temperature 97.9 F (36.6 C), temperature source Oral, resp. rate 16, height 5' (1.524 m), weight 92.1 kg, last menstrual period 03/25/2019, SpO2 99 %, currently breastfeeding. General appearance: alert and cooperative, walking the halls Lungs: Breathing  comfortably Abdomen: soft Presentation: cephalic confirmed with U/S in MAU  Fetal monitoringBaseline: 140 bpm, Variability: Good {> 6 bpm), Accelerations: Reactive and Decelerations: Absent Uterine activity irregular  Dilation: 1.5 Effacement (%): 60 Station: -3 Exam by:: jolynn   Prenatal labs: ABO, Rh: B/Positive/-- (03/08 1346) Antibody: Negative (03/08 1346) Rubella: 18.70 (03/08 1346) RPR: Non Reactive (07/12 0000)  HBsAg: Negative (03/08 1346)  HIV: Non Reactive (07/12 0859)  GBS: Negative/-- (09/09 1150)  1 hr Glucola 198, normal 3 hour Genetic screening  First screen negative  Anatomy US normal female   Prenatal Transfer Tool  Maternal Diabetes: No Genetic Screening: Normal Maternal Ultrasounds/Referrals: Normal Fetal Ultrasounds or other Referrals:  None Maternal Substance Abuse:  No Significant Maternal Medications:  None Significant Maternal Lab Results: Group B Strep negative  Results for orders placed or performed during the hospital encounter of 12/14/19 (from the past 24 hour(s))  Fern Test   Collection Time: 12/14/19  7:32 AM  Result Value Ref Range   POCT Fern Test Positive = ruptured amniotic membanes   Respiratory Panel by RT PCR (Flu A&B, Covid) - Nasopharyngeal Swab   Collection Time: 12/14/19  7:53 AM   Specimen: Nasopharyngeal Swab  Result Value Ref Range   SARS Coronavirus 2 by RT PCR NEGATIVE NEGATIVE   Influenza A by PCR NEGATIVE NEGATIVE   Influenza B by PCR NEGATIVE NEGATIVE    Patient Active Problem List   Diagnosis Date Noted  . Indication for care in labor and delivery, antepartum 12/14/2019  . Supervision of normal pregnancy in third trimester 05/26/2019  . Abnormal Pap smear of cervix 09/10/2018  . Sickle cell trait (HCC) 03/13/2016  . GERD (gastroesophageal reflux disease) 03/05/2014    Assessment/Plan:  [name] is a 29 y.o. G3P1011 at [redacted]w[redacted]d here for SROM and decreased fetal movement. Pregnancy complicated by sickle  cell trait and history of PEC in previous pregnancy, currently on ASA.   #Labor: SROM clear at home around 0600 9/28, desire minimal intervention as possible. Will allow continued hall walking and recheck cervix in 1 hour. Pending cervical exam, patient is willing to proceed with FB placement.  #Pain: Desires non-medicated birth  #FWB: Cat 1  #ID: GBS negative  #MOF: Breastfeeding  #MOC: Depo injection  #Circ: Yes, inpatient   #History of PEC in prior pregnancy:  On ASA. BP wnl and asymptomatic.   GME ATTESTATION:  I saw and evaluated the patient. I agree with the findings and the plan of care as documented in the resident's note.  Alric Seton, MD OB Fellow, Faculty Practice [facility] 12/14/2019 12:05 PM   Allayne Stack, DO  12/14/2019, 10:40 AM

## 2019-12-14 NOTE — Discharge Instructions (Signed)

## 2019-12-15 ENCOUNTER — Encounter (HOSPITAL_COMMUNITY): Payer: Self-pay | Admitting: Family Medicine

## 2019-12-15 DIAGNOSIS — O326XX0 Maternal care for compound presentation, not applicable or unspecified: Secondary | ICD-10-CM | POA: Diagnosis not present

## 2019-12-15 DIAGNOSIS — O9902 Anemia complicating childbirth: Secondary | ICD-10-CM | POA: Diagnosis not present

## 2019-12-15 DIAGNOSIS — O4202 Full-term premature rupture of membranes, onset of labor within 24 hours of rupture: Secondary | ICD-10-CM | POA: Diagnosis not present

## 2019-12-15 DIAGNOSIS — O368131 Decreased fetal movements, third trimester, fetus 1: Secondary | ICD-10-CM | POA: Diagnosis not present

## 2019-12-15 DIAGNOSIS — D573 Sickle-cell trait: Secondary | ICD-10-CM | POA: Diagnosis not present

## 2019-12-15 DIAGNOSIS — Z3A38 38 weeks gestation of pregnancy: Secondary | ICD-10-CM | POA: Diagnosis not present

## 2019-12-15 NOTE — Lactation Note (Signed)
This note was copied from a baby's chart. Lactation Consultation Note Baby 7 hrs old.  Mom has a 29 yr old at home that she BF for 1 yr. Mom stated this baby is feeding good.  LC discussed positioning, support, breast massage, STS, I&O, newborn feeding habits, supply and demand. Mom encouraged to feed baby 8-12 times/24 hours and with feeding cues. Mom encouraged to waken baby for feeds in 3 hrs if hasn't cued.  Mom has large breast w/med/large everted nipples. Baby latched well. Hand expression w/colostrum noted. Mom excited to see colostrum because she said there wasn't anything there.   Mom kept pulling back on breast tissue. Asked mom to stop she will cause a shallow latch. Mom is worried baby can't breast. Explained this is a good position for baby to breath in. Encouraged mom to ask for assistance or if has any question. Lactation brochure given.   Patient Name: Lauren Douglas BMWUX'L Date: 12/15/2019 Reason for consult: Initial assessment;Early term 37-38.6wks   Maternal Data Has patient been taught Hand Expression?: Yes Does the patient have breastfeeding experience prior to this delivery?: Yes  Feeding Feeding Type: Breast Fed  LATCH Score Latch: Grasps breast easily, tongue down, lips flanged, rhythmical sucking.  Audible Swallowing: A few with stimulation  Type of Nipple: Everted at rest and after stimulation  Comfort (Breast/Nipple): Soft / non-tender  Hold (Positioning): Assistance needed to correctly position infant at breast and maintain latch.  LATCH Score: 8  Interventions Interventions: Breast feeding basics reviewed;Assisted with latch;Breast compression;Skin to skin;Adjust position;Breast massage;Support pillows;Hand express;Position options;Coconut oil  Lactation Tools Discussed/Used WIC Program: Yes   Consult Status Consult Status: Follow-up Date: 12/16/19 Follow-up type: In-patient    Charyl Dancer 12/15/2019, 2:39 AM

## 2019-12-15 NOTE — Progress Notes (Signed)
POSTPARTUM PROGRESS NOTE  Post Partum Day1  Subjective:  Lauren Douglas is a 29 y.o. V3K1224 s/p VD at [redacted]w[redacted]d.  She reports she is doing well. No acute events overnight. She denies any problems with ambulating, voiding or po intake. Denies nausea or vomiting.  Pain is well controlled.  Lochia is mild.  Objective: Blood pressure 124/74, pulse 85, temperature 98.1 F (36.7 C), temperature source Oral, resp. rate 18, height 5' (1.524 m), weight 92.1 kg, last menstrual period 03/25/2019, SpO2 99 %, currently breastfeeding.  Physical Exam:  General: alert, cooperative and no distress Chest: no respiratory distress Heart: distal pulses intact Abdomen: soft, nontender Uterine Fundus: firm, appropriately tender Extremities: no edema Skin: warm, dry  Recent Labs    12/14/19 0935  HGB 17.0*  HCT 51.2*    Assessment/Plan: Lauren Douglas is a 29 y.o. S9P5300 s/p VD at [redacted]w[redacted]d.  PPD#1 - Doing well  Routine postpartum care  Contraception: depo at pp visit.  Feeding: breastfeeding  Dispo: Plan for discharge tomorrow. Infant will need circ prior to dc.    LOS: 1 day   Leticia Penna, DO Family Medicine PGY-3  12/15/2019, 11:11 AM

## 2019-12-16 MED ORDER — IBUPROFEN 600 MG PO TABS: 600.0000 mg | ORAL_TABLET | Freq: Four times a day (QID) | ORAL | 0 refills | Status: AC | PRN

## 2019-12-16 NOTE — Lactation Note (Signed)
This note was copied from a baby's chart. Lactation Consultation Note Attempted to see mom. Mom sleeping soundly. Room w/lights dimmed. Baby sleeping.  Patient Name: Boy Jezel Basto BDZHG'D Date: 12/16/2019     Maternal Data    Feeding Feeding Type: Breast Fed  LATCH Score                   Interventions    Lactation Tools Discussed/Used     Consult Status      Corderro Koloski, Diamond Nickel 12/16/2019, 3:23 AM

## 2019-12-16 NOTE — Lactation Note (Addendum)
This note was copied from a baby's chart. Lactation Consultation Note  Patient Name: Boy Kaydin Labo NIOEV'O Date: 12/16/2019   Infant is 58 hrs old. Mom showed me diaper from trash can. Infant's stool was brown/yellow & seedy. Mom has a hx of an abundant supply with her 1st child. Mom's breasts are heavy & she reports increase in heaviness & size since yesterday. She affirms that her breasts feel lighter with feedings. Infant slept through consult.  Mom is unsure of what kind of DEBP she ordered. Her nipples need larger than a size 24 flange. Hand pump was provided with size 27 flange; transitional milk immediately was immediately noted. Size 30 flanges were also provided, if needed.   Mom has coconut oil; Mom was placed in shells to prevent coconut oil from rubbing off. Mom has # to call us for post-d/c questions.   Mom inquired about needing formula. I assured her that she would likely be able to exclusively breastfeed/provide breast milk.  Lurline Hare [facility] 12/16/2019, 10:27 AM

## 2019-12-17 ENCOUNTER — Telehealth: Payer: Self-pay

## 2019-12-17 ENCOUNTER — Encounter: Payer: Medicaid Other | Admitting: Family Medicine

## 2019-12-17 NOTE — Telephone Encounter (Signed)
Transition Care Management Unsuccessful Follow-up Telephone Call  Date of discharge and from where:  [facility] [facility] 12/16/2019  Attempts:  1st Attempt  Reason for unsuccessful TCM follow-up call:  Left voice message    

## 2019-12-20 NOTE — Telephone Encounter (Signed)
Transition Care Management Follow-up Telephone Call  Date of discharge and from where: [facility]  How have you been since you were released from the hospital? Doing better  Any questions or concerns? No  Items Reviewed:  Did the pt receive and understand the discharge instructions provided? Yes   Medications obtained and verified? Yes  Any new allergies since your discharge? No   Dietary orders reviewed? Yes  Do you have support at home? Yes   Functional Questionnaire: (I = Independent and D = Dependent) ADLs: I  Bathing/Dressing- I  Meal Prep- I  Eating- I   Maintaining continence- I   Transferring/Ambulation- I  Managing Meds- I  Follow up appointments reviewed:   PCP Hospital f/u appt confirmed? Yes  Terisa Starr 10/29 at 11:10  [facility] f/u appt confirmed? No  Are transportation arrangements needed? No   If their condition worsens, is the pt aware to call PCP or go to the Emergency Dept.? Yes  Was the patient provided with contact information for the PCP's office or ED? Yes  Was to pt encouraged to call back with questions or concerns? Yes

## 2019-12-27 ENCOUNTER — Encounter: Payer: Self-pay | Admitting: Family Medicine

## 2019-12-28 NOTE — Telephone Encounter (Signed)
Called mother to discuss concerns. No answer, left HIPPA compliant VM for mother to return phone call to office.   Veronda Prude, RN

## 2019-12-28 NOTE — Telephone Encounter (Signed)
Patient returns call to nurse line. Please see phone message in baby's chart.   Veronda Prude, RN

## 2019-12-30 ENCOUNTER — Telehealth: Payer: Self-pay | Admitting: Family Medicine

## 2019-12-30 NOTE — Telephone Encounter (Signed)
Reviewed, completed, and signed form.  Note routed to RN team inbasket and placed completed form in Clinic RN's office (wall pocket above desk).  Brightyn Mozer M Delcia Spitzley, MD   

## 2019-12-30 NOTE — Telephone Encounter (Signed)
Continuance of Disability form dropped off for at front desk for completion.  Verified that patient section of form has been completed.  Last DOS/WCC with PCP was 12/10/19.  Placed form in team folder to be completed by clinical staff.  Machelle A McCain

## 2019-12-30 NOTE — Telephone Encounter (Signed)
Reviewed form and placed in PCP's box for completion.  .Anup Brigham R Inette Doubrava, CMA  

## 2019-12-31 NOTE — Telephone Encounter (Signed)
Form faxed to the number provided and a copy was made for batch scanning.

## 2020-01-10 ENCOUNTER — Encounter: Payer: Self-pay | Admitting: Family Medicine

## 2020-01-14 ENCOUNTER — Encounter: Payer: Self-pay | Admitting: Family Medicine

## 2020-01-14 ENCOUNTER — Ambulatory Visit (INDEPENDENT_AMBULATORY_CARE_PROVIDER_SITE_OTHER): Payer: Medicaid Other | Admitting: Family Medicine

## 2020-01-14 ENCOUNTER — Other Ambulatory Visit: Payer: Self-pay

## 2020-01-14 DIAGNOSIS — Z30013 Encounter for initial prescription of injectable contraceptive: Secondary | ICD-10-CM | POA: Diagnosis not present

## 2020-01-14 DIAGNOSIS — R87610 Atypical squamous cells of undetermined significance on cytologic smear of cervix (ASC-US): Secondary | ICD-10-CM | POA: Diagnosis not present

## 2020-01-14 LAB — POCT URINE PREGNANCY: Preg Test, Ur: POSITIVE — AB

## 2020-01-14 MED ORDER — MEDROXYPROGESTERONE ACETATE 150 MG/ML IM SUSY
150.0000 mg | PREFILLED_SYRINGE | Freq: Once | INTRAMUSCULAR | Status: AC
  Administered 2020-01-14: 150 mg via INTRAMUSCULAR

## 2020-01-14 NOTE — Patient Instructions (Signed)
You can return to sexual activity in ~ 2 weeks  Think about a Nexplanon  - last 3 years - Sometimes headaches are a bit worse than usual  It was wonderful to see you today.  Please bring ALL of your medications with you to every visit.    Thank you for choosing [facility].   Please call [phone] with any questions about today's appointment.  Please be sure to schedule follow up at the front  desk before you leave today.  Follow up in 1-2 months for a Nexplanon   Terisa Starr, MD  Family Medicine

## 2020-01-14 NOTE — Progress Notes (Signed)
Patient presented for Depo-Provera.  Was given a urine pregnancy test.  Test was positive.  Patient has given birth within the last month and assured Dr. Manson Passey that she has not had intercourse.  Dr. Manson Passey gave permission to proceed with injection.  Patient given LUOQ and given reminder card January 14-28, 2022.  Patient tolerated injection well.  Glennie Hawk, CMA

## 2020-01-14 NOTE — Progress Notes (Signed)
  [facility] Postpartum Visit   Lauren Douglas is a 29 y.o. I4P8099 presenting for a postpartum visit.  She has the following concerns today: The patient is curious about options for birth control.  She denies specific problems today.  She is doing well with breast-feeding.  She denies problems with postpartum depression.  She reports she has had for about 1 day but has been very happy.  Yesterday was her birthday and she attained citizenship She delivered via spontaneous vaginal delivery without any pain control at 38 weeks and 5 days.  Pregnancy was relatively uncomplicated.  Her G1 pregnancy she did have pregnancy-induced hypertension.  This has not been a problem since that time and her blood pressure is normal today..  She reports her vaginal bleeding is absent. She is breast feeding her infant. She feels she is bonding well. She is considering Depo for contraception.  Reviewed pregnancy and delivery course has a history of an abnormal Pap subsequently had a normal and HPV negative Pap.  Per Dr. Donnetta Hail note recommend repeat in 3 years..  -  Vitals:   01/14/20 1104  BP: 122/78   Exam:  HEENT: Sclera anicteric. Dentition is moderate. Appears well hydrated. Neck: Supple Cardiac: Regular rate and rhythm. Normal S1/S2. No murmurs, rubs, or gallops appreciated. Lungs: Clear bilaterally to ascultation.  Abdomen is soft nontender nondistended Pelvic exam: Chaperone pelvic exam is without abnormality  A/P:  Postpartum visit: patient is 4 weeks postpartum following a vaginal delivery. -Patient has urinary incontinence? No -Patient is safe to resume physical and sexual activity in two weeks-discussed that her positive pregnancy test today could indicate a new pregnancy.  She is adamant she has not had intercourse in 9 months.  She reports this have to be immaculate conception.  We discussed this it is most likely related to her prior pregnancy as this can remain positive for up  to 5 to 6 weeks.  As such Depo was given today we have discussed the risks if she were to actually be pregnant.  She accepted these risks.  We also discussed other options.  She is going to message me in a few months if she wants a Nexplanon or IUD.  We discussed both today -Return to sexual activity and contraception discussed as above.  -Discussed birth spacing of 18 months -Breastfeeding: Yes, provided support of decision and resources as indicated  -Mood: doing well  -Discussed sleep and fatigue management and encouraged family/community support.  -Postpartum vaccines: discuss COVID at follow up -Need for postpartum diabetes screening:  NA (2 hour glucose tolerance testing between 6-12 weeks postpartum)  -Reviewed prior Pap and she is next due for Pap 3 years .   Terisa Starr, MD  Family Medicine Teaching Service

## 2020-02-14 ENCOUNTER — Encounter: Payer: Self-pay | Admitting: Family Medicine

## 2020-02-14 ENCOUNTER — Other Ambulatory Visit: Payer: Self-pay | Admitting: Family Medicine

## 2020-02-14 ENCOUNTER — Ambulatory Visit (INDEPENDENT_AMBULATORY_CARE_PROVIDER_SITE_OTHER): Payer: Medicaid Other | Admitting: Family Medicine

## 2020-02-14 DIAGNOSIS — Z23 Encounter for immunization: Secondary | ICD-10-CM

## 2020-02-14 LAB — POCT URINE PREGNANCY: Preg Test, Ur: NEGATIVE

## 2020-02-14 NOTE — Progress Notes (Signed)
Patient received Depo Provera at last visit in postpartum period. Follow up today for nurse visit for POC pregnancy test. Test negative.  Patient has no concerns, she will return for Depo-Provera. Also received COVID booster today.   Terisa Starr, MD  Family Medicine Teaching Service

## 2020-02-14 NOTE — Patient Instructions (Signed)
It was wonderful to see you today.  Follow up in the spring     Thank you for choosing [facility].   Please call [phone] with any questions about today's appointment.  Please be sure to schedule follow up at the front  desk before you leave today.   Terisa Starr, MD  Family Medicine

## 2020-02-14 NOTE — Progress Notes (Signed)
   Covid-19 Vaccination Clinic  Name:  Purity Irmen    MRN: [id number] DOB: [birth year]  02/14/2020  Ms. Bebo was observed post Covid-19 immunization for 15 minutes without incident. She was provided with Vaccine Information Sheet and instruction to access the V-Safe system.   Ms. Brindley was instructed to call 911 with any severe reactions post vaccine: Marland Kitchen Difficulty breathing  . Swelling of face and throat  . A fast heartbeat  . A bad rash all over body  . Dizziness and weakness

## 2020-04-17 ENCOUNTER — Ambulatory Visit (INDEPENDENT_AMBULATORY_CARE_PROVIDER_SITE_OTHER): Payer: Medicaid Other

## 2020-04-17 ENCOUNTER — Other Ambulatory Visit: Payer: Self-pay

## 2020-04-17 DIAGNOSIS — Z3042 Encounter for surveillance of injectable contraceptive: Secondary | ICD-10-CM

## 2020-04-17 LAB — POCT URINE PREGNANCY: Preg Test, Ur: NEGATIVE

## 2020-04-17 MED ORDER — MEDROXYPROGESTERONE ACETATE 150 MG/ML IM SUSP
150.0000 mg | INTRAMUSCULAR | Status: AC
  Administered 2020-04-17 – 2020-07-07 (×2): 150 mg via INTRAMUSCULAR

## 2020-04-17 NOTE — Progress Notes (Signed)
Patient here today for Depo Provera injection and is not within her dates. Urine pregnancy obtained with negative result.  Advised patient to return to clinic in two weeks for nurse visit for pregnancy test, or patient can take home test, if unable to come back to clinic in this time frame. Also instructed patient to use alternative birth control for the next 7 days. Patient verbalizes understanding.   Last contraceptive appt was 02/14/2020  Depo given in RUOQ today.  Site unremarkable & patient tolerated injection.    Next injection due 4/18-5/2.  Reminder card given.    Veronda Prude, RN

## 2020-06-14 IMAGING — US US MFM OB DETAIL+14 WK
1 series · 13 of 28 positions shown · non-contrast
Comparison: none

[Series 1: us mfm ob detail+14 wk · 13 of 90 slices shown]
[im 4/90]
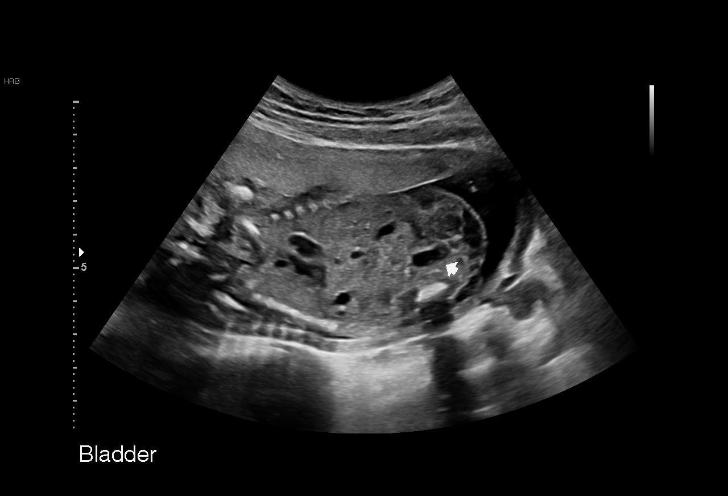
[im 10/90]
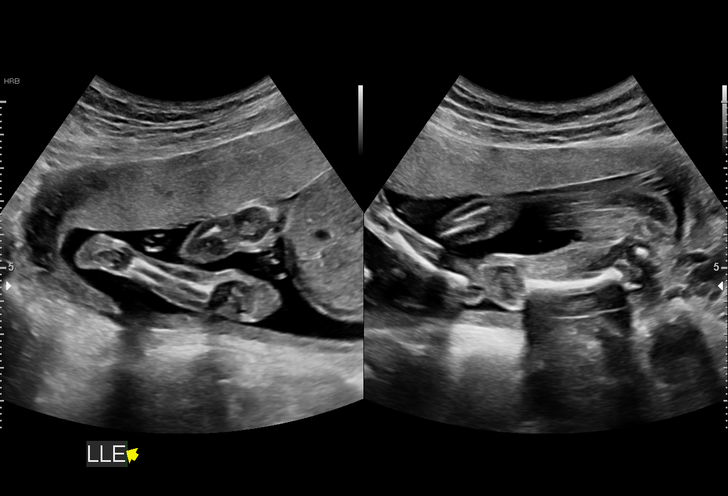
[im 17/90]
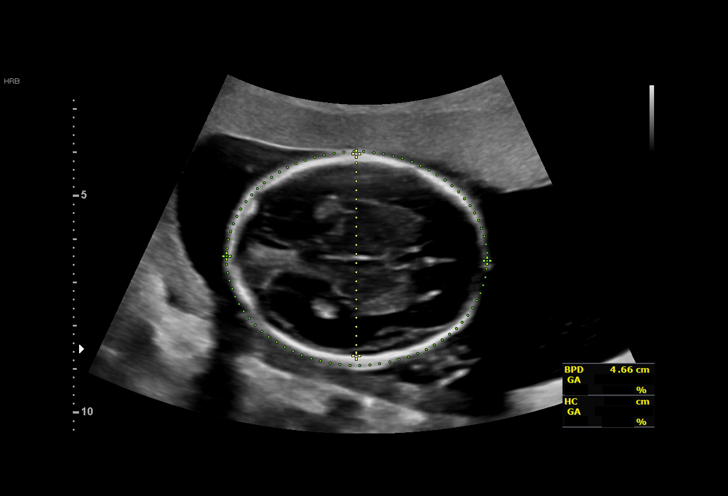
[im 24/90]
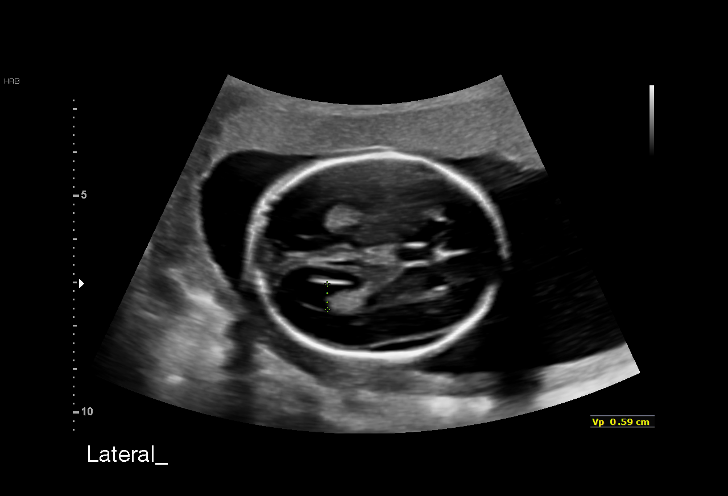
[im 30/90]
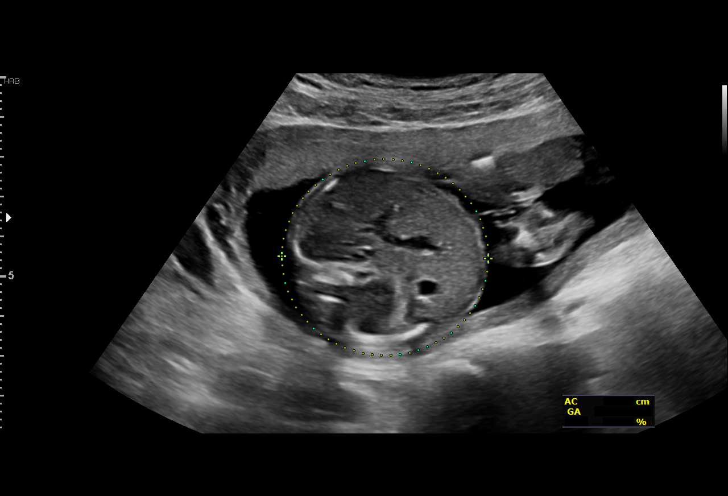
[im 37/90]
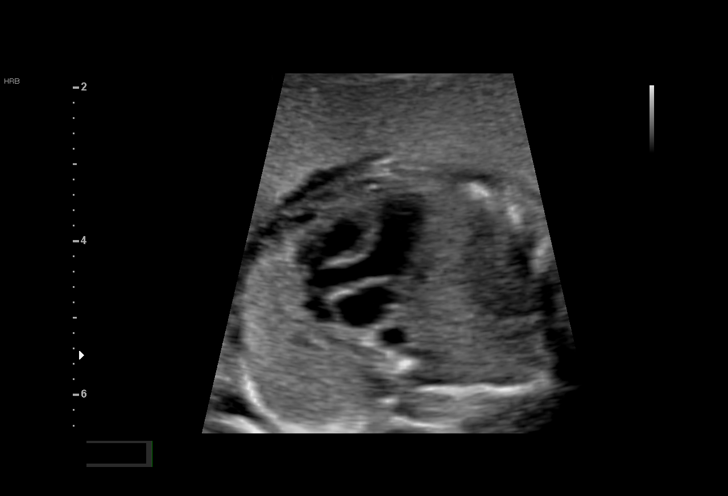
[im 47/90]
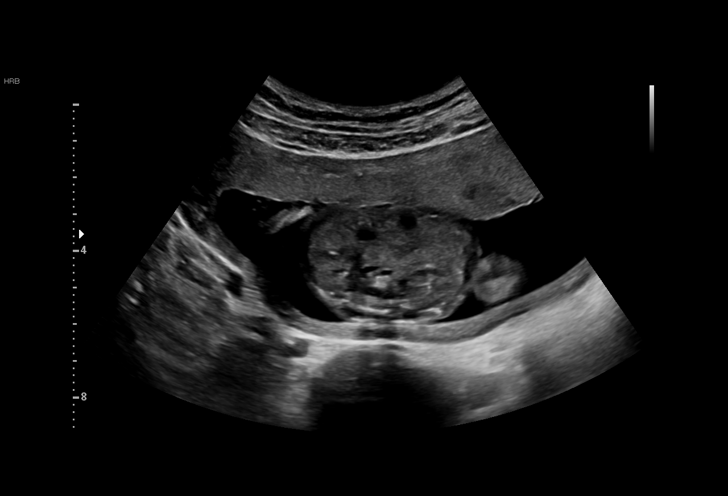
[im 53/90]
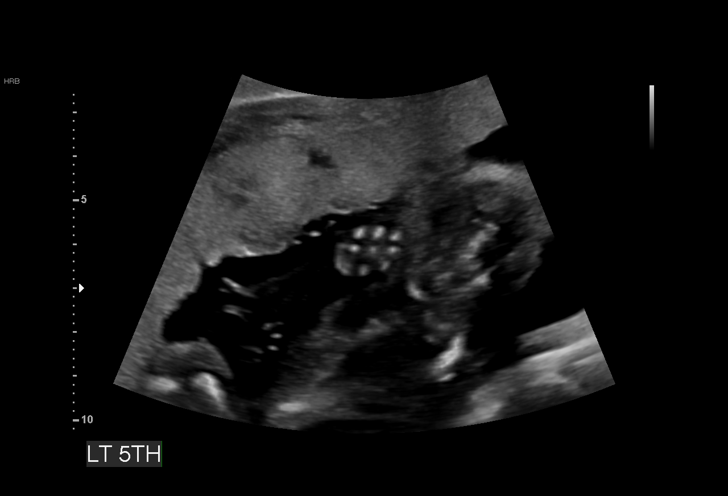
[im 60/90]
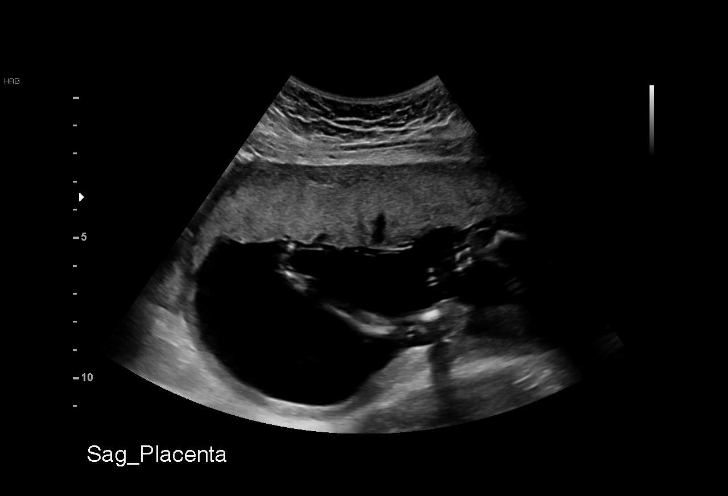
[im 66/90]
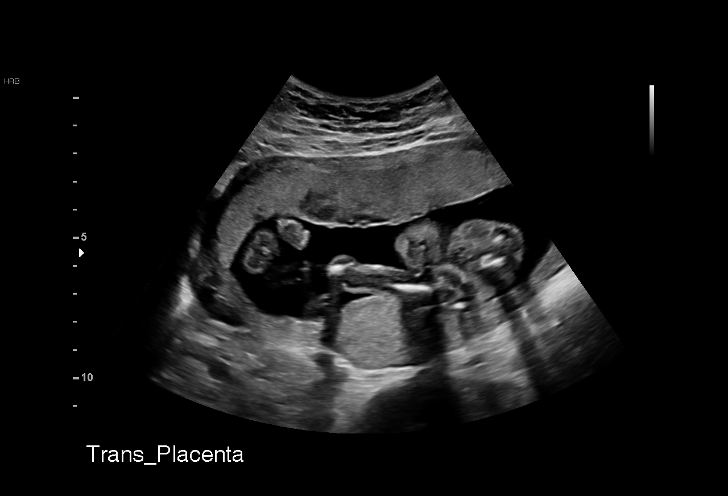
[im 73/90]
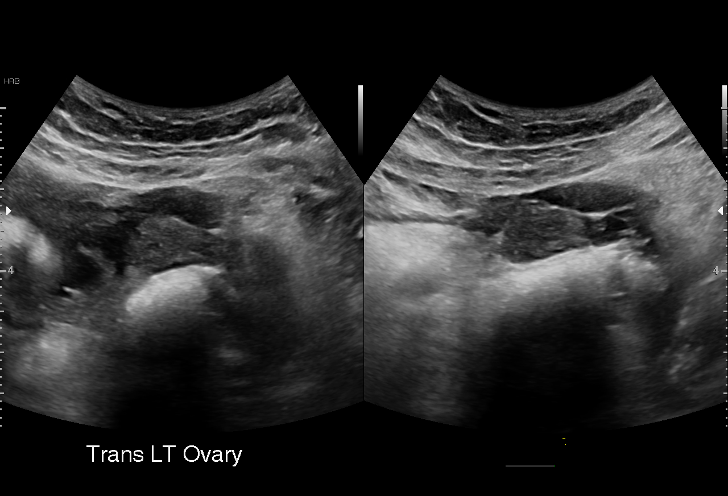
[im 80/90]
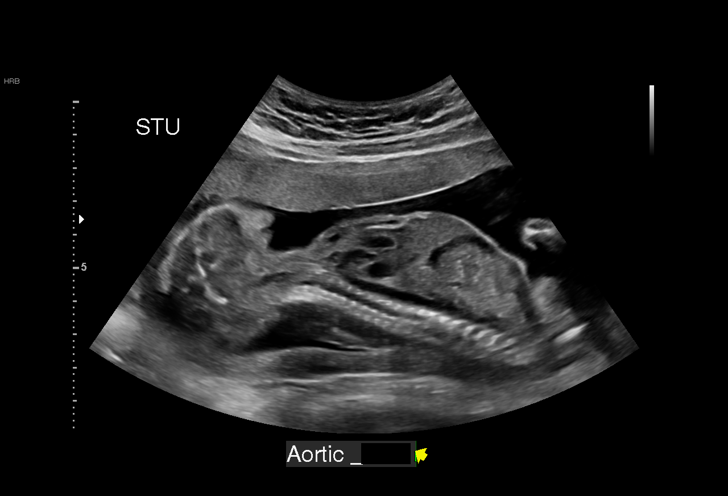
[im 86/90]
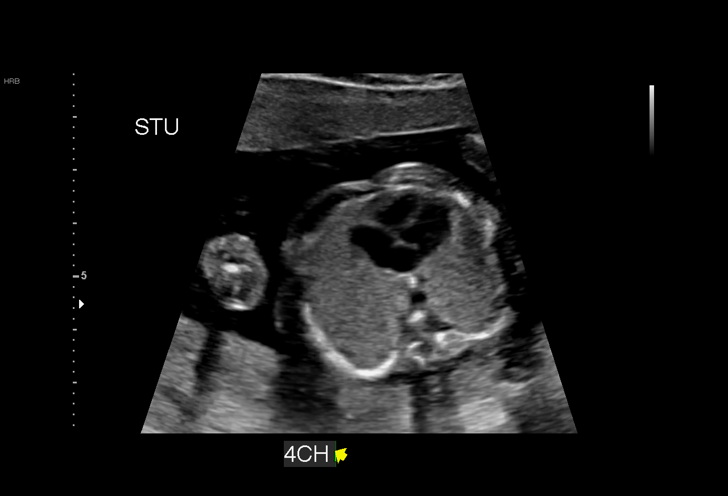

[13 of 28 positions shown; findings below may reference images not displayed]

[place]
                   MITO HAWKS

Indications

 Encounter for antenatal screening for
 malformations (negative quad screen)
 Poor obstetric history: Previous
 preeclampsia / eclampsia/gestational HTN
 (ASA)
 History of sickle cell trait
 Obesity complicating pregnancy, second
 trimester (BMI 32)
 19 weeks gestation of pregnancy
Vital Signs

 BMI:
Fetal Evaluation

 Num Of Fetuses:          1
 Cardiac Activity:        Observed
 Presentation:            Breech
 Placenta:                Anterior
 P. Cord Insertion:       Previously Visualized

 Amniotic Fluid
 AFI FV:      Within normal limits

                             Largest Pocket(cm)

Biometry
 BPD:      46.5  mm     G. Age:  20w 0d         59  %    CI:          73.6  %    70 - 86
                                                         FL/HC:       17.0  %    16.8 -
 HC:      172.2  mm     G. Age:  19w 6d         39  %    HC/AC:       1.13       1.09 -
 AC:      152.5  mm     G. Age:  20w 3d         65  %    FL/BPD:      62.8  %
 FL:       29.2  mm     G. Age:  19w 0d         15  %    FL/AC:       19.1  %    20 - 24
 CER:      20.4  mm     G. Age:  19w 3d         40  %
 NFT:       5.5  mm
 LV:        5.9  mm
 CM:        3.8  mm

 Est. FW:     313   gm   0 lb 11 oz      41  %
OB History

 Gravidity:    3         Term:   1         SAB:   1
 Living:       1
Gestational Age

 LMP:           18w 6d        Date:  03/25/19                 EDD:   12/30/19
 U/S Today:     19w 6d                                        EDD:   12/23/19
 Best:          19w 6d     Det. By:  Early Ultrasound         EDD:   12/23/19
                                     (06/07/19)
Anatomy

 Cranium:               Appears normal         Aortic Arch:            Appears normal
 Cavum:                 Appears normal         Ductal Arch:            Appears normal
 Ventricles:            Appears normal         Diaphragm:              Appears normal
 Choroid Plexus:        Appears normal         Stomach:                Appears normal, left
                                                                       sided
 Cerebellum:            Appears normal         Abdomen:                Appears normal
 Posterior Fossa:       Appears normal         Abdominal Wall:         Appears nml (cord
                                                                       insert, abd wall)
 Nuchal Fold:           Appears normal         Cord Vessels:           Appears normal (3
                                                                       vessel cord)
 Face:                  Appears normal         Kidneys:                Appear normal
                        (orbits and profile)
 Lips:                  Appears normal         Bladder:                Appears normal
 Thoracic:              Appears normal         Spine:                  Not well visualized
 Heart:                 Appears normal         Upper Extremities:      Appears normal
                        (4CH, axis, and
                        situs)
 RVOT:                  Appears normal         Lower Extremities:      Appears normal
 LVOT:                  Appears normal

 Other:  Male gender. Lt Heel and 5th digit visualized. Technically difficult due
         to fetal position.
Cervix Uterus Adnexa

 Cervix
 Length:           3.52  cm.
 Normal appearance by transabdominal scan.

 Uterus
 No abnormality visualized.
 Right Ovary
 Within normal limits.

 Left Ovary
 Within normal limits.

 Cul De Sac
 No free fluid seen.

 Adnexa
 No abnormality visualized.
Impression

 Follow up growth
 Normal interval growth with measurements consistent with
 dates.
 Good fetal movement and amniotic fluid
 Suboptimal views of the fetal anatomy was obtained
 secondary to fetal position.
Recommendations

 Follow up growth scheduled in 4-6 weeks.

## 2020-07-04 ENCOUNTER — Ambulatory Visit: Payer: Medicaid Other

## 2020-07-07 ENCOUNTER — Other Ambulatory Visit: Payer: Self-pay

## 2020-07-07 ENCOUNTER — Ambulatory Visit (INDEPENDENT_AMBULATORY_CARE_PROVIDER_SITE_OTHER): Payer: Medicaid Other

## 2020-07-07 DIAGNOSIS — Z3042 Encounter for surveillance of injectable contraceptive: Secondary | ICD-10-CM | POA: Diagnosis not present

## 2020-07-07 NOTE — Progress Notes (Signed)
Patient here today for Depo Provera injection and is within her dates.    Last contraceptive appt was 02/14/2020  Depo given in LUOQ today.  Site unremarkable & patient tolerated injection.    Next injection due 7/8-7/22.  Reminder card given.    Veronda Prude, RN

## 2020-07-12 IMAGING — US US MFM OB FOLLOW-UP
1 series · 13 of 28 positions shown · non-contrast
Comparison: none

[Series 1: us mfm ob follow-up · 13 of 91 slices shown]
[im 4/91]
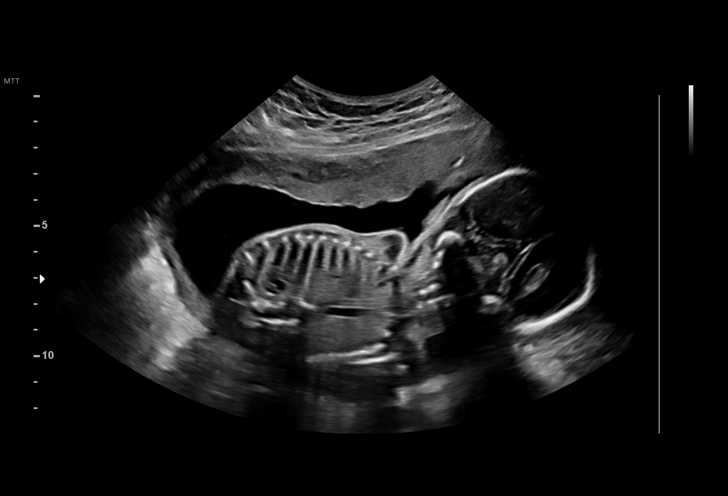
[im 11/91]
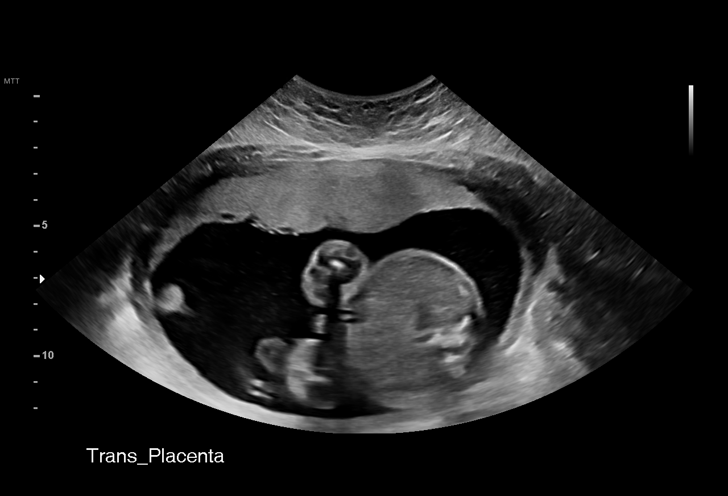
[im 17/91]
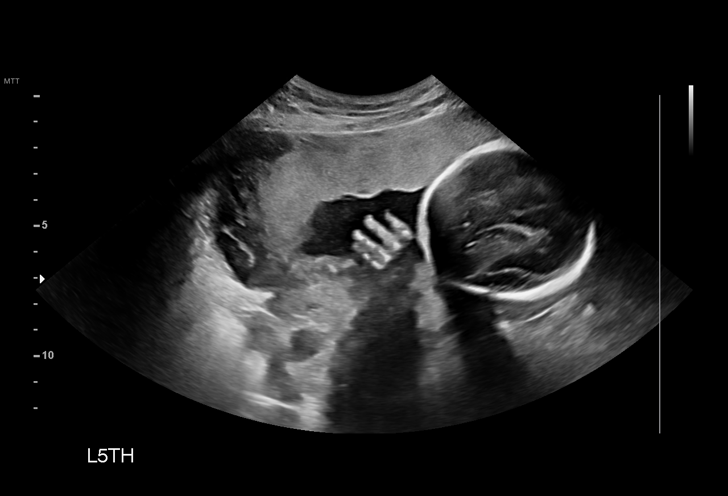
[im 24/91]
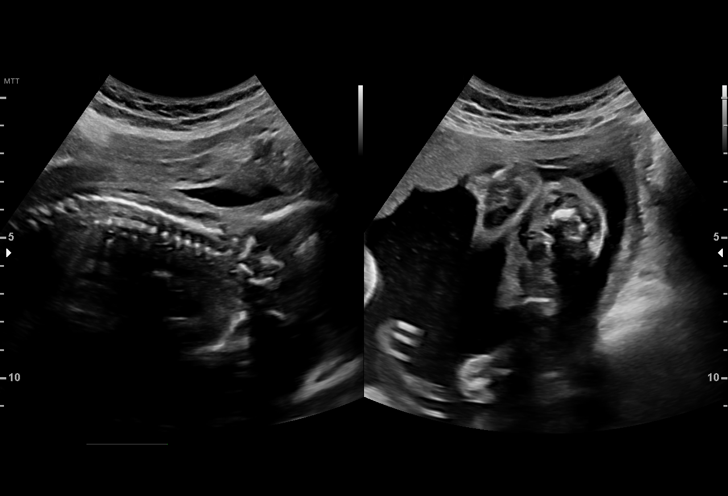
[im 31/91]
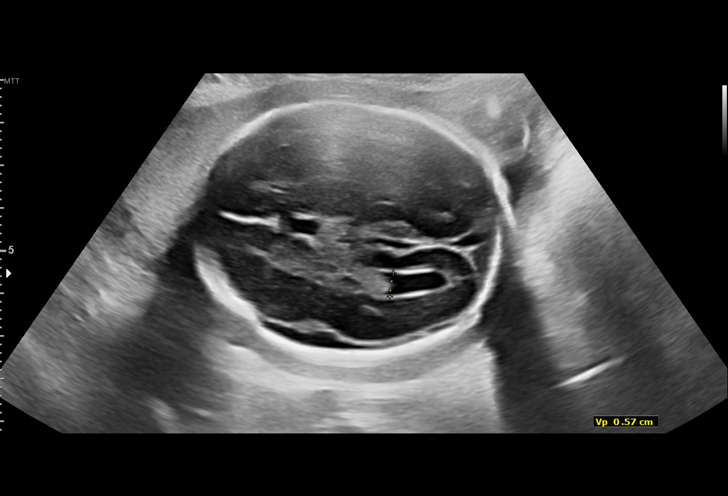
[im 37/91]
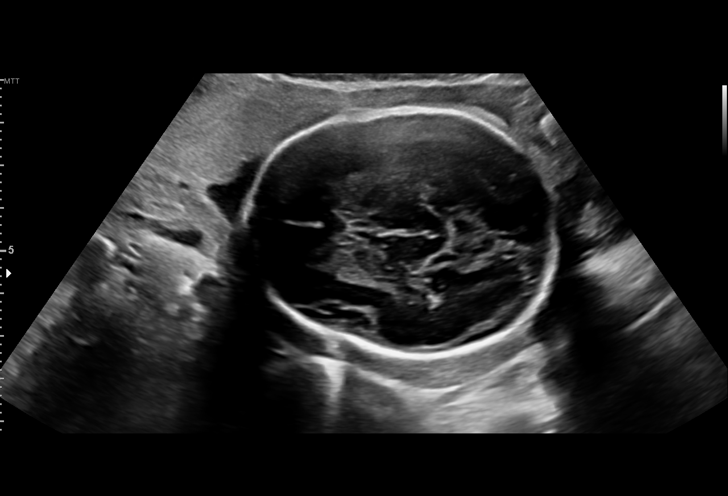
[im 47/91]
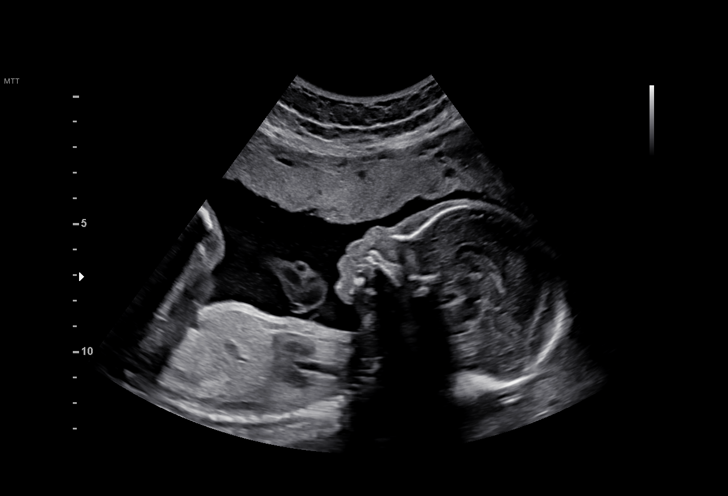
[im 54/91]
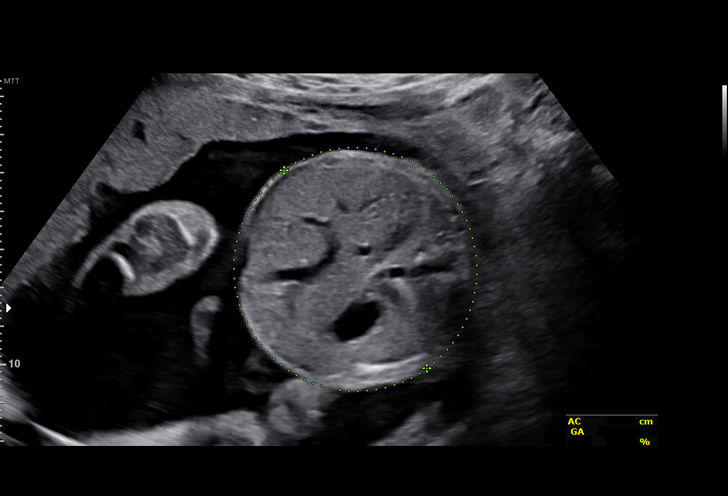
[im 61/91]
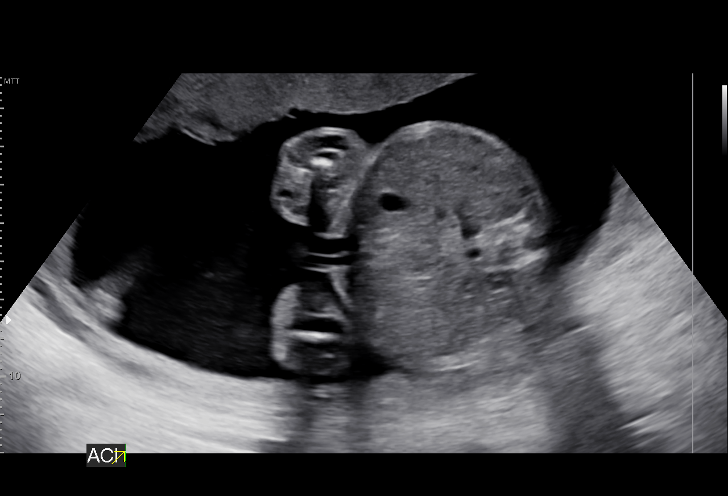
[im 67/91]
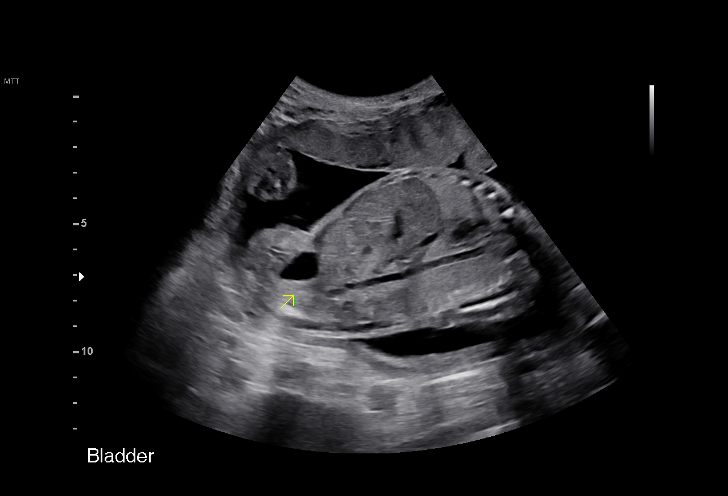
[im 74/91]
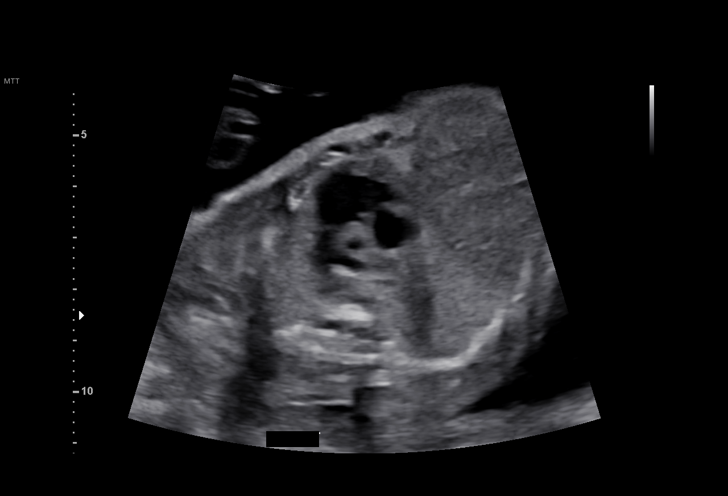
[im 81/91]
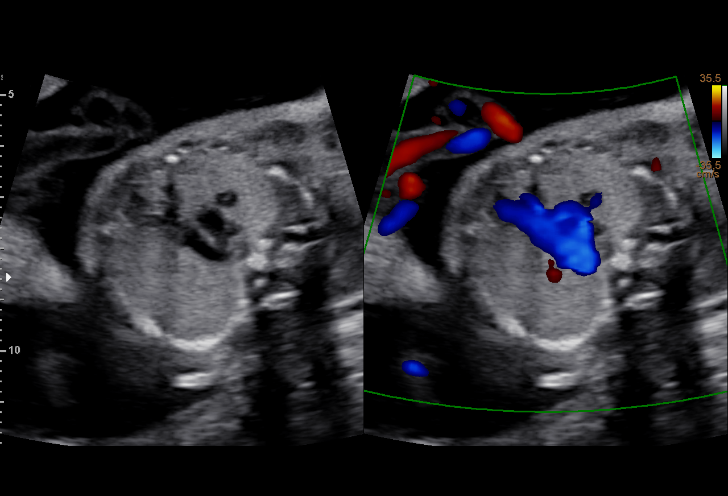
[im 87/91]
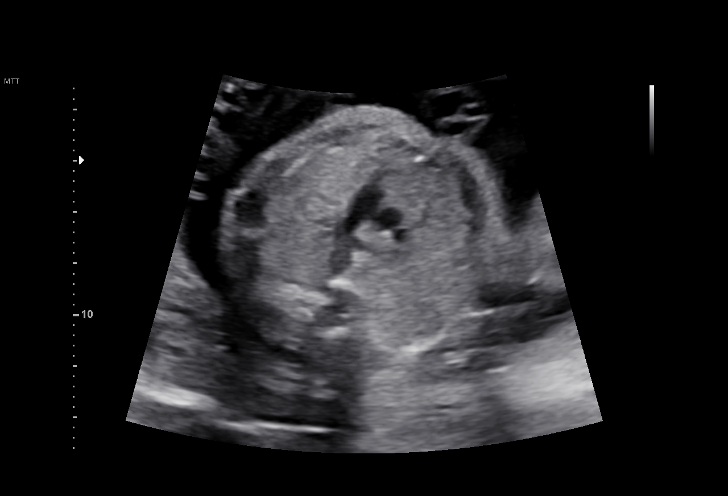

[13 of 28 positions shown; findings below may reference images not displayed]

[place]
                   KAEL TANAKA

                                                      FIALA

Indications

 Obesity complicating pregnancy, second
 trimester (BMI 32)
 23 weeks gestation of pregnancy
 Poor obstetric history: Previous
 preeclampsia / eclampsia/gestational HTN
 (ASA)
 History of sickle cell trait
 Antenatal follow-up for nonvisualized fetal
 anatomy
Vital Signs

                                                Height:        5'1"
Fetal Evaluation

 Num Of Fetuses:         1
 Fetal Heart Rate(bpm):  147
 Cardiac Activity:       Observed
 Presentation:           Cephalic
 Placenta:               Anterior
 P. Cord Insertion:      Previously Visualized

 Amniotic Fluid
 AFI FV:      Within normal limits

                             Largest Pocket(cm)

Biometry
 BPD:      57.7  mm     G. Age:  23w 5d         37  %    CI:         73.4   %    70 - 86
                                                         FL/HC:      18.7   %    18.7 -
 HC:       214   mm     G. Age:  23w 3d         19  %    HC/AC:      1.09        1.05 -
 AC:      196.4  mm     G. Age:  24w 2d         56  %    FL/BPD:     69.3   %    71 - 87
 FL:         40  mm     G. Age:  22w 6d         13  %    FL/AC:      20.4   %    20 - 24
 CER:      27.3  mm     G. Age:  24w 3d         78  %
 LV:        5.7  mm
 CM:        5.7  mm

 Est. FW:     618  gm      1 lb 6 oz     33  %
OB History

 Gravidity:    3         Term:   1         SAB:   1
 Living:       1
Gestational Age

 LMP:           22w 6d        Date:  03/25/19                 EDD:   12/30/19
 U/S Today:     23w 4d                                        EDD:   12/25/19
 Best:          23w 6d     Det. By:  Early Ultrasound         EDD:   12/23/19
                                     (06/07/19)
Anatomy

 Cranium:               Appears normal         Aortic Arch:            Appears normal
 Cavum:                 Appears normal         Ductal Arch:            Appears normal
 Ventricles:            Appears normal         Diaphragm:              Appears normal
 Choroid Plexus:        Previously seen        Stomach:                Appears normal, left
                                                                       sided
 Cerebellum:            Appears normal         Abdomen:                Appears normal
 Posterior Fossa:       Previously seen        Abdominal Wall:         Appears nml (cord
                                                                       insert, abd wall)
 Nuchal Fold:           Previously seen        Cord Vessels:           Previously seen
 Face:                  Orbits and profile     Kidneys:                Appear normal
                        previously seen
 Lips:                  Appears normal         Bladder:                Appears normal
 Thoracic:              Appears normal         Spine:                  Limited views
                                                                       appear normal
 Heart:                 Appears normal         Upper Extremities:      Previously seen
                        (4CH, axis, and
                        situs)
 RVOT:                  Appears normal         Lower Extremities:      Previously seen
 LVOT:                  Appears normal

 Other:  Lt Heel and 5th digit previously visualized.  Technically difficult due to
         fetal position.
Cervix Uterus Adnexa

 Cervix
 Length:           3.51  cm.
 Normal appearance by transabdominal scan.

 Uterus
 No abnormality visualized.

 Right Ovary
 Within normal limits.
 Left Ovary
 Within normal limits.

 Cul De Sac
 No free fluid seen.

 Adnexa
 No abnormality visualized.
Comments

 This patient was seen for a follow up exam as the views of
 the fetal anatomy were unable to be fully visualized during
 her last exam.  She denies any problems since her last exam.
 She was informed that the fetal growth and amniotic fluid
 level appears appropriate for her gestational age.
 The views of the fetal anatomy were visualized today.  There
 were no obvious anomalies noted.
 The limitations of ultrasound in the detection of all anomalies
 was discussed.
 Follow-up as indicated.

## 2020-09-22 ENCOUNTER — Encounter: Payer: Self-pay | Admitting: Family Medicine

## 2020-09-22 ENCOUNTER — Other Ambulatory Visit: Payer: Self-pay

## 2020-09-22 ENCOUNTER — Ambulatory Visit (INDEPENDENT_AMBULATORY_CARE_PROVIDER_SITE_OTHER): Payer: Medicaid Other | Admitting: Family Medicine

## 2020-09-22 ENCOUNTER — Ambulatory Visit (INDEPENDENT_AMBULATORY_CARE_PROVIDER_SITE_OTHER): Payer: Medicaid Other

## 2020-09-22 VITALS — BP 122/80 | HR 83 | Wt 163.8 lb

## 2020-09-22 DIAGNOSIS — Z0000 Encounter for general adult medical examination without abnormal findings: Secondary | ICD-10-CM

## 2020-09-22 DIAGNOSIS — E663 Overweight: Secondary | ICD-10-CM

## 2020-09-22 DIAGNOSIS — Z30019 Encounter for initial prescription of contraceptives, unspecified: Secondary | ICD-10-CM | POA: Diagnosis not present

## 2020-09-22 MED ORDER — MEDROXYPROGESTERONE ACETATE 150 MG/ML IM SUSP
150.0000 mg | Freq: Once | INTRAMUSCULAR | Status: AC
  Administered 2020-09-22: 150 mg via INTRAMUSCULAR

## 2020-09-22 NOTE — Patient Instructions (Signed)
It was wonderful to see you today.  Please bring ALL of your medications with you to every visit.   Today we talked about:  ---Reducing juice and sweet tea  --- Increasing leafy greens  --Talking with Dr. Gerilyn Pilgrim about strategies to keep your energy and meet your weight loss goals    Thank you for choosing [facility].   Please call [phone] with any questions about today's appointment.  Please be sure to schedule follow up at the front  desk before you leave today.   Terisa Starr, MD  Family Medicine

## 2020-09-22 NOTE — Assessment & Plan Note (Signed)
Discussed dietary changes Does not snack but does have fast food frequently--loves chicked (fried) burgers Has limited time between work and two kids (Rama and Madelaine Bhat) Discussed increasing baked chicken, discussing with Dr. Gerilyn Pilgrim  Card for Dr. Gerilyn Pilgrim given

## 2020-09-22 NOTE — Progress Notes (Signed)
Patient here today for Depo Provera injection and is within her dates.     Last contraceptive appt was 02/14/2020.   Depo given in RUOQ today. Site unremarkable & patient tolerated injection.     Next injection due 12/08/2020-12/22/2020. Reminder card given.

## 2020-09-22 NOTE — Progress Notes (Signed)
    SUBJECTIVE:   Chief compliant/HPI: annual examination  Lauren Douglas is a 30 y.o. who presents today for an annual exam.   She is worried about her weight. She reports she does not snack, drinks mostly water. Much of diet is burgers and fried chicken.   Review of systems form notable for no headaches, chest pain, dyspnea. Regularly sees dentist and eye doctor.   Updated history tabs and problem list yes-- has history of abnormal Pap, due in 3 years .   OBJECTIVE:   BP 122/80   Pulse 83   Wt 163 lb 12.8 oz (74.3 kg)   SpO2 98%   BMI 31.99 kg/m    Cardiac: Regular rate and rhythm. Normal S1/S2. No murmurs, rubs, or gallops appreciated. Lungs: Clear bilaterally to ascultation.  Abdomen: Normoactive bowel sounds. No tenderness to deep or light palpation. No rebound or guarding.  Psych: Pleasant and appropriate  Lower extremities without edema   ASSESSMENT/PLAN:   Overweight Discussed dietary changes Does not snack but does have fast food frequently--loves chicked (fried) burgers Has limited time between work and two kids (Rama and Administrator, sports) Discussed increasing baked chicken, discussing with Dr. Jenne Campus  Card for Dr. Jenne Campus given  A1C ordered, will call or message with results   History of anemia in pregnancy  - CBC and ferritin today  History of Gestational Hypertension Appropriate BP today Will monitor Recommend lipids at follow up   Annual Examination  See AVS for age appropriate recommendations.   Blood pressure reviewed and at goal.  Asked about intimate partner violence and patient reports none.  The patient currently uses Depo for contraception. Folate recommended as appropriate, minimum of 400 mcg per day.  Advanced directives not discussed   Considered the following items based upon USPSTF recommendations: Declined STI testing--sexually active with 1 partner  Lipid panel (nonfasting or fasting) discussed based upon AHA recommendations and not ordered.   Consider repeat every 4-6 years. Consider at follow up  Reviewed risk factors for latent tuberculosis and not indicated  Discussed family history, BRCA testing not indicated. Tool used to risk stratify was Pedigree Assessment tool--no family history of cancer. Mother is doing well--father just passed. Uncertain condition (died in [place]).  Cervical cancer screening: prior Pap reviewed, repeat due in 2024 Immunizations up to date on COVID   Follow up in 1 year or sooner if indicated.   Martyn Malay, MD [place]

## 2020-09-23 LAB — FERRITIN: Ferritin: 71 ng/mL (ref 15–150)

## 2020-09-23 LAB — HEMOGLOBIN A1C
Est. average glucose Bld gHb Est-mCnc: 108 mg/dL
Hgb A1c MFr Bld: 5.4 % (ref 4.8–5.6)

## 2020-09-23 LAB — CBC
Hematocrit: 41.8 % (ref 34.0–46.6)
Hemoglobin: 14 g/dL (ref 11.1–15.9)
MCH: 29.3 pg (ref 26.6–33.0)
MCHC: 33.5 g/dL (ref 31.5–35.7)
MCV: 87 fL (ref 79–97)
Platelets: 305 10*3/uL (ref 150–450)
RBC: 4.78 x10E6/uL (ref 3.77–5.28)
RDW: 11.9 % (ref 11.7–15.4)
WBC: 5.6 10*3/uL (ref 3.4–10.8)

## 2020-12-15 ENCOUNTER — Ambulatory Visit: Payer: Medicaid Other

## 2021-01-15 ENCOUNTER — Ambulatory Visit (INDEPENDENT_AMBULATORY_CARE_PROVIDER_SITE_OTHER): Payer: Medicaid Other

## 2021-01-15 ENCOUNTER — Other Ambulatory Visit: Payer: Self-pay

## 2021-01-15 DIAGNOSIS — Z30019 Encounter for initial prescription of contraceptives, unspecified: Secondary | ICD-10-CM

## 2021-01-15 LAB — POCT URINE PREGNANCY: Preg Test, Ur: NEGATIVE

## 2021-01-15 MED ORDER — MEDROXYPROGESTERONE ACETATE 150 MG/ML IM SUSP
150.0000 mg | Freq: Once | INTRAMUSCULAR | Status: AC
  Administered 2021-01-15: 150 mg via INTRAMUSCULAR

## 2021-01-15 NOTE — Progress Notes (Signed)
Patient here today for Depo Provera injection and is not within her dates.   Pregnancy test obtained and negative.    Last contraceptive appt was 09/22/2020.   Depo given in LUOQ today. Site unremarkable & patient tolerated injection.     Next injection due 04/02/2021-01/30/203. Reminder card given.

## 2021-02-15 ENCOUNTER — Other Ambulatory Visit: Payer: Self-pay

## 2021-02-15 ENCOUNTER — Ambulatory Visit (INDEPENDENT_AMBULATORY_CARE_PROVIDER_SITE_OTHER): Payer: Medicaid Other

## 2021-02-15 DIAGNOSIS — Z23 Encounter for immunization: Secondary | ICD-10-CM

## 2021-03-26 ENCOUNTER — Ambulatory Visit: Payer: Medicaid Other

## 2021-03-26 ENCOUNTER — Other Ambulatory Visit: Payer: Self-pay

## 2021-04-06 ENCOUNTER — Ambulatory Visit: Payer: Medicaid Other

## 2021-04-10 ENCOUNTER — Other Ambulatory Visit: Payer: Self-pay

## 2021-04-10 ENCOUNTER — Ambulatory Visit (INDEPENDENT_AMBULATORY_CARE_PROVIDER_SITE_OTHER): Payer: Medicaid Other

## 2021-04-10 DIAGNOSIS — Z30019 Encounter for initial prescription of contraceptives, unspecified: Secondary | ICD-10-CM | POA: Diagnosis not present

## 2021-04-10 MED ORDER — MEDROXYPROGESTERONE ACETATE 150 MG/ML IM SUSP
150.0000 mg | Freq: Once | INTRAMUSCULAR | Status: AC
  Administered 2021-04-10: 17:00:00 150 mg via INTRAMUSCULAR

## 2021-04-10 NOTE — Progress Notes (Signed)
Patient here today for Depo Provera injection and is within her dates.    Last contraceptive appt was 09/22/2020.   Depo given in RUOQ today. Site unremarkable & patient tolerated injection.     Next injection due 06/26/2021-07/10/2021. Reminder card given.

## 2021-07-05 ENCOUNTER — Ambulatory Visit (INDEPENDENT_AMBULATORY_CARE_PROVIDER_SITE_OTHER): Payer: Medicaid Other | Admitting: Student

## 2021-07-05 ENCOUNTER — Other Ambulatory Visit: Payer: Self-pay

## 2021-07-05 VITALS — BP 118/89 | HR 84 | Wt 161.6 lb

## 2021-07-05 DIAGNOSIS — Z0000 Encounter for general adult medical examination without abnormal findings: Secondary | ICD-10-CM

## 2021-07-05 DIAGNOSIS — Z309 Encounter for contraceptive management, unspecified: Secondary | ICD-10-CM

## 2021-07-05 MED ORDER — MEDROXYPROGESTERONE ACETATE 150 MG/ML IM SUSY
150.0000 mg | PREFILLED_SYRINGE | Freq: Once | INTRAMUSCULAR | Status: AC
  Administered 2021-07-05: 150 mg via INTRAMUSCULAR

## 2021-07-05 NOTE — Patient Instructions (Signed)
It was great to see you! Thank you for allowing me to participate in your care! ? ?I recommend that you always bring your medications to each appointment as this makes it easy to ensure you are on the correct medications and helps [place] not miss when refills are needed. ? ?Our plans for today:  ?- I will send in a prescription for the patch for birthcontrol. You can start this in 3 months from today. ?-Please make a lab only visit to have labs drawn at your convenience  ?- Please return in 1 year for your next wellness exam ? ? ?Take care and seek immediate care sooner if you develop any concerns.  ? ?Dr. Erick Alley, DO ?[facility] ? ?

## 2021-07-05 NOTE — Progress Notes (Signed)
? ? ?  SUBJECTIVE:  ? ?Chief compliant/HPI: annual examination ? ?Lauren Douglas is a 31 y.o. who presents today for an annual exam.  ?Pt works as a med aid in facilities and requests letter for work to keep her away from patients who smoke and from pets as smoking makes her cough and feel like she cannot breath well and pets make her feel very uneasy. ? ?Would like depo shot today but is interested in a different form of birth control as she feels she is gaining weight on Depo.  She is very interested in the patch.  Does not want to take pills as they have to be taken every day. ? ?Updated history tabs and problem list.  ? ?OBJECTIVE:  ? ?BP 118/89   Pulse 84   Wt 161 lb 9.6 oz (73.3 kg)   SpO2 100%   BMI 31.56 kg/m?   ? ? ?ASSESSMENT/PLAN:  ? ? ?Annual Examination  ?See AVS for age appropriate recommendations.  ? ?PHQ score 0, reviewed and discussed. ?Blood pressure reviewed and at goal .  ?Asked about intimate partner violence and patient reports.  ?The patient currently uses depo for contraception, interested in starting the patch. Folate recommended as appropriate, minimum of 400 mcg per day.  ? ? ?Considered the following items based upon USPSTF recommendations: ?HIV testing:  previously tested negative ?Hepatitis C:  previously tested negative ?Syphilis if at high risk: discussed ?GC/CT not at high risk and not ordered. ?Lipid panel (nonfasting or fasting) discussed based upon AHA recommendations and ordered.  Consider repeat every 4-6 years.  ? ? ?Cervical cancer screening: prior Pap reviewed, repeat due in July 2023 ?Immunizations UTD  ? ?she received depo shot today and is interested in a different form of BC.  Unfortunately, BMI is just over 30 so patient cannot use the patch.  She is advised to return in about 2 months to discuss contraception.  Advised patient that BMI can be rechecked at that visit and if under 30 can start patch. ? ?Letter for work provided requesting accommodations to allow pt  to not be around people who smoke and pets.  ? ?Follow up in 1  year or sooner if indicated.  ? ?Lab closed ?-return for labs needed for work: TB gold, varicella titer, rubella screen ?-return for lipid panel  ? ?Erick Alley, DO ?[facility]   ?

## 2021-07-10 ENCOUNTER — Other Ambulatory Visit: Payer: Medicaid Other

## 2021-07-10 DIAGNOSIS — Z0000 Encounter for general adult medical examination without abnormal findings: Secondary | ICD-10-CM

## 2021-07-10 DIAGNOSIS — Z309 Encounter for contraceptive management, unspecified: Secondary | ICD-10-CM | POA: Diagnosis not present

## 2021-07-14 LAB — RUBELLA SCREEN: Rubella Antibodies, IGG: 17.5 index (ref >0.99)

## 2021-07-14 LAB — QUANTIFERON-TB GOLD PLUS
QuantiFERON Mitogen Value: >10 IU/mL
QuantiFERON Nil Value: 0 IU/mL
QuantiFERON TB1 Ag Value: 0.41 IU/mL
QuantiFERON TB2 Ag Value: 0.14 IU/mL
QuantiFERON-TB Gold Plus: POSITIVE — AB

## 2021-07-14 LAB — LIPID PANEL
Chol/HDL Ratio: 2.7 ratio (ref 0.0–4.4)
Cholesterol, Total: 112 mg/dL (ref 100–199)
HDL: 41 mg/dL (ref >39)
LDL Chol Calc (NIH): 66 mg/dL (ref 0–99)
Triglycerides: 6 mg/dL (ref 0–149)
VLDL Cholesterol Cal: 5 mg/dL (ref 5–40)

## 2021-07-14 LAB — VARICELLA ZOSTER ANTIBODY, IGG: Varicella zoster IgG: 779 index (ref >165)

## 2021-07-16 ENCOUNTER — Telehealth: Payer: Self-pay | Admitting: Student

## 2021-07-16 DIAGNOSIS — R7612 Nonspecific reaction to cell mediated immunity measurement of gamma interferon antigen response without active tuberculosis: Secondary | ICD-10-CM

## 2021-07-16 NOTE — Telephone Encounter (Signed)
Called patient, verified identity with birth date. We discussed the recent positive TB gold test. Pt denied any symptoms of TB including cough, fever, weight loss, or night sweats and states she has never been treated for TB before. She agrees to have chest x ray this week and we discussed the possibility that she may need treatment for latent TB.  ?

## 2021-07-16 NOTE — Telephone Encounter (Signed)
Called pt and left HIPAA compliant VM to call clinic back and ask to speak with me to go over recent TB test results.  ?

## 2021-07-16 NOTE — Telephone Encounter (Signed)
Patient returns call to nurse line.  Will forward back to provider.  

## 2021-07-18 ENCOUNTER — Ambulatory Visit
Admission: RE | Admit: 2021-07-18 | Discharge: 2021-07-18 | Disposition: A | Discharge status: Home / Self Care | Payer: Medicaid Other | Source: Ambulatory Visit | Attending: Family Medicine | Admitting: Family Medicine

## 2021-07-18 DIAGNOSIS — R7612 Nonspecific reaction to cell mediated immunity measurement of gamma interferon antigen response without active tuberculosis: Secondary | ICD-10-CM

## 2021-07-18 DIAGNOSIS — R7611 Nonspecific reaction to tuberculin skin test without active tuberculosis: Secondary | ICD-10-CM | POA: Diagnosis not present

## 2021-07-19 ENCOUNTER — Telehealth: Payer: Self-pay | Admitting: Family Medicine

## 2021-07-19 NOTE — Telephone Encounter (Signed)
Patient contacted and advised of school form ready for pick up.  ? ?[employer] attached.  ? ?Copy made for batch scanning.  ? ? ?

## 2021-07-19 NOTE — Telephone Encounter (Signed)
Called patient to discuss results. CXR negative for infection. Discussed recommendation for treatment latent Tb. ?Left voicemail for Tammie at [facility] about result. Patient to call to schedule visit for treatment.  ? ?Nursing- can you please pull [employer] vaccine record and attach to form? Please also fax positive Tb result to health department in [facility]. ? ?Reviewed, completed, and signed form.  Note routed to RN team inbasket and placed completed form in RN Wall pocket in the front office.   ? ?Terisa Starr, MD  ?Family Medicine Teaching Service  ? ?

## 2021-08-21 ENCOUNTER — Encounter: Payer: Self-pay | Admitting: *Deleted

## 2021-10-01 ENCOUNTER — Ambulatory Visit (INDEPENDENT_AMBULATORY_CARE_PROVIDER_SITE_OTHER): Payer: Medicaid Other

## 2021-10-01 DIAGNOSIS — Z3042 Encounter for surveillance of injectable contraceptive: Secondary | ICD-10-CM | POA: Diagnosis not present

## 2021-10-01 MED ORDER — MEDROXYPROGESTERONE ACETATE 150 MG/ML IM SUSP
150.0000 mg | Freq: Once | INTRAMUSCULAR | Status: AC
  Administered 2021-10-01: 150 mg via INTRAMUSCULAR

## 2021-10-01 NOTE — Progress Notes (Signed)
Patient here today for Depo Provera injection and is within her dates.    Last contraceptive appt was 07/05/2021  Depo given in LUOQ today.  Site unremarkable & patient tolerated injection.    Next injection due 10/2-10/16/23.  Reminder card given.    Veronda Prude, RN

## 2021-10-09 ENCOUNTER — Encounter: Payer: Self-pay | Admitting: Family Medicine

## 2021-12-20 ENCOUNTER — Ambulatory Visit (INDEPENDENT_AMBULATORY_CARE_PROVIDER_SITE_OTHER): Payer: Medicaid Other

## 2021-12-20 DIAGNOSIS — Z23 Encounter for immunization: Secondary | ICD-10-CM | POA: Diagnosis not present

## 2022-03-15 ENCOUNTER — Encounter: Payer: Self-pay | Admitting: Family Medicine

## 2022-05-03 ENCOUNTER — Ambulatory Visit (INDEPENDENT_AMBULATORY_CARE_PROVIDER_SITE_OTHER): Payer: Medicaid Other | Admitting: Family Medicine

## 2022-05-03 ENCOUNTER — Encounter: Payer: Self-pay | Admitting: Family Medicine

## 2022-05-03 VITALS — BP 124/80 | HR 91 | Wt 180.0 lb

## 2022-05-03 DIAGNOSIS — Z6835 Body mass index (BMI) 35.0-35.9, adult: Secondary | ICD-10-CM

## 2022-05-03 DIAGNOSIS — N912 Amenorrhea, unspecified: Secondary | ICD-10-CM | POA: Diagnosis not present

## 2022-05-03 LAB — POCT URINE PREGNANCY: Preg Test, Ur: NEGATIVE

## 2022-05-03 NOTE — Progress Notes (Signed)
    SUBJECTIVE:   CHIEF COMPLAINT / HPI:   Lauren Douglas is a 32 y.o. female who presents to the [facility] clinic today to discuss the following concerns:   Amenorrhea Patient had previously been on Depo for contraception, last Depo injection was 10/01/21. She had been on the Depo for about 2 years, was not having periods while on it. She cannot recall when her last menstrual period was. She reports that she was also previously on Depo after the birth of her first child and at that time she was still getting regular periods. When she went back on it after the birth of her two year old she no longer had regular periods.   She reports seldomly being sexually active. Does not use barrier protection. She would like more kids but not right now. She started taking prenatal vitamins.   She has done home pregnancy tests which have been negative.  She notes feeling worried because her breasts are uncomfortable when she removes her bra. No breast pain at the present moment. Sometimes she feels like she has milk in her breast but denies any nipple discharge or milk being expressed.  PERTINENT  PMH / PSH: GERD, sickle cell trait   OBJECTIVE:   BP 124/80   Pulse 91   Wt 180 lb (81.6 kg)   SpO2 99%   BMI 35.15 kg/m    General: NAD, pleasant, able to participate in exam Cardiac: RRR, no murmurs. Respiratory: CTAB, normal effort, No wheezes, rales or rhonchi Abdomen: Soft, obese Psych: Normal affect and mood  ASSESSMENT/PLAN:   1. Amenorrhea Now over 6 months since last Depo injection, still with no menses. Upreg negative. Will check labs today. If labs return normal, plan to give 10 day course of Porvera 10 mg daily to induce withdrawal bleed. Discussed plan with patient who is aware and agreeable.  - POCT urine pregnancy - Prolactin - TSH Rfx on Abnormal to Free T4 - FSH/LH  2. BMI 35.0-35.9,adult Motivated to lose weight, interested in weight loss clinic for further management.  - Amb Ref  to Medical Weight Management    Lauren Douglas, [place]

## 2022-05-03 NOTE — Patient Instructions (Addendum)
It was wonderful to see you today.  Please bring ALL of your medications with you to every visit.   Today we talked about:  Continue the prenatal vitamins daily.  Use condoms to protect against sexually transmitted infections and unwanted pregnancy. We are doing lab work today to check some hormone levels to see if this is why you are not getting your period. We will contact you through [place].   Thank you for coming to your visit as scheduled. We have had a large "no-show" problem lately, and this significantly limits our ability to see and care for patients. As a friendly reminder- if you cannot make your appointment please call to cancel. We do have a no show policy for those who do not cancel within 24 hours. Our policy is that if you miss or fail to cancel an appointment within 24 hours, 3 times in a 100-monthperiod, you may be dismissed from our clinic.   Thank you for choosing [facility]   Please call [phone] any questions about today's appointment.  Please be sure to schedule follow up at the front  desk before you leave today.   ASharion Settler DO PGY-3 Family Medicine

## 2022-05-04 LAB — FSH/LH
FSH: 3 m[IU]/mL
LH: 6.3 m[IU]/mL

## 2022-05-04 LAB — PROLACTIN: Prolactin: 16.9 ng/mL (ref 4.8–33.4)

## 2022-05-05 LAB — TSH RFX ON ABNORMAL TO FREE T4: TSH: 1.34 u[IU]/mL (ref 0.450–4.500)

## 2022-05-29 IMAGING — CR DG CHEST 2V
2 series · 2 of 2 positions shown · non-contrast
Comparison: August 29, 2014

CLINICAL DATA: Positive TB test

EXAM:
CHEST - 2 VIEW

[w chest pa]
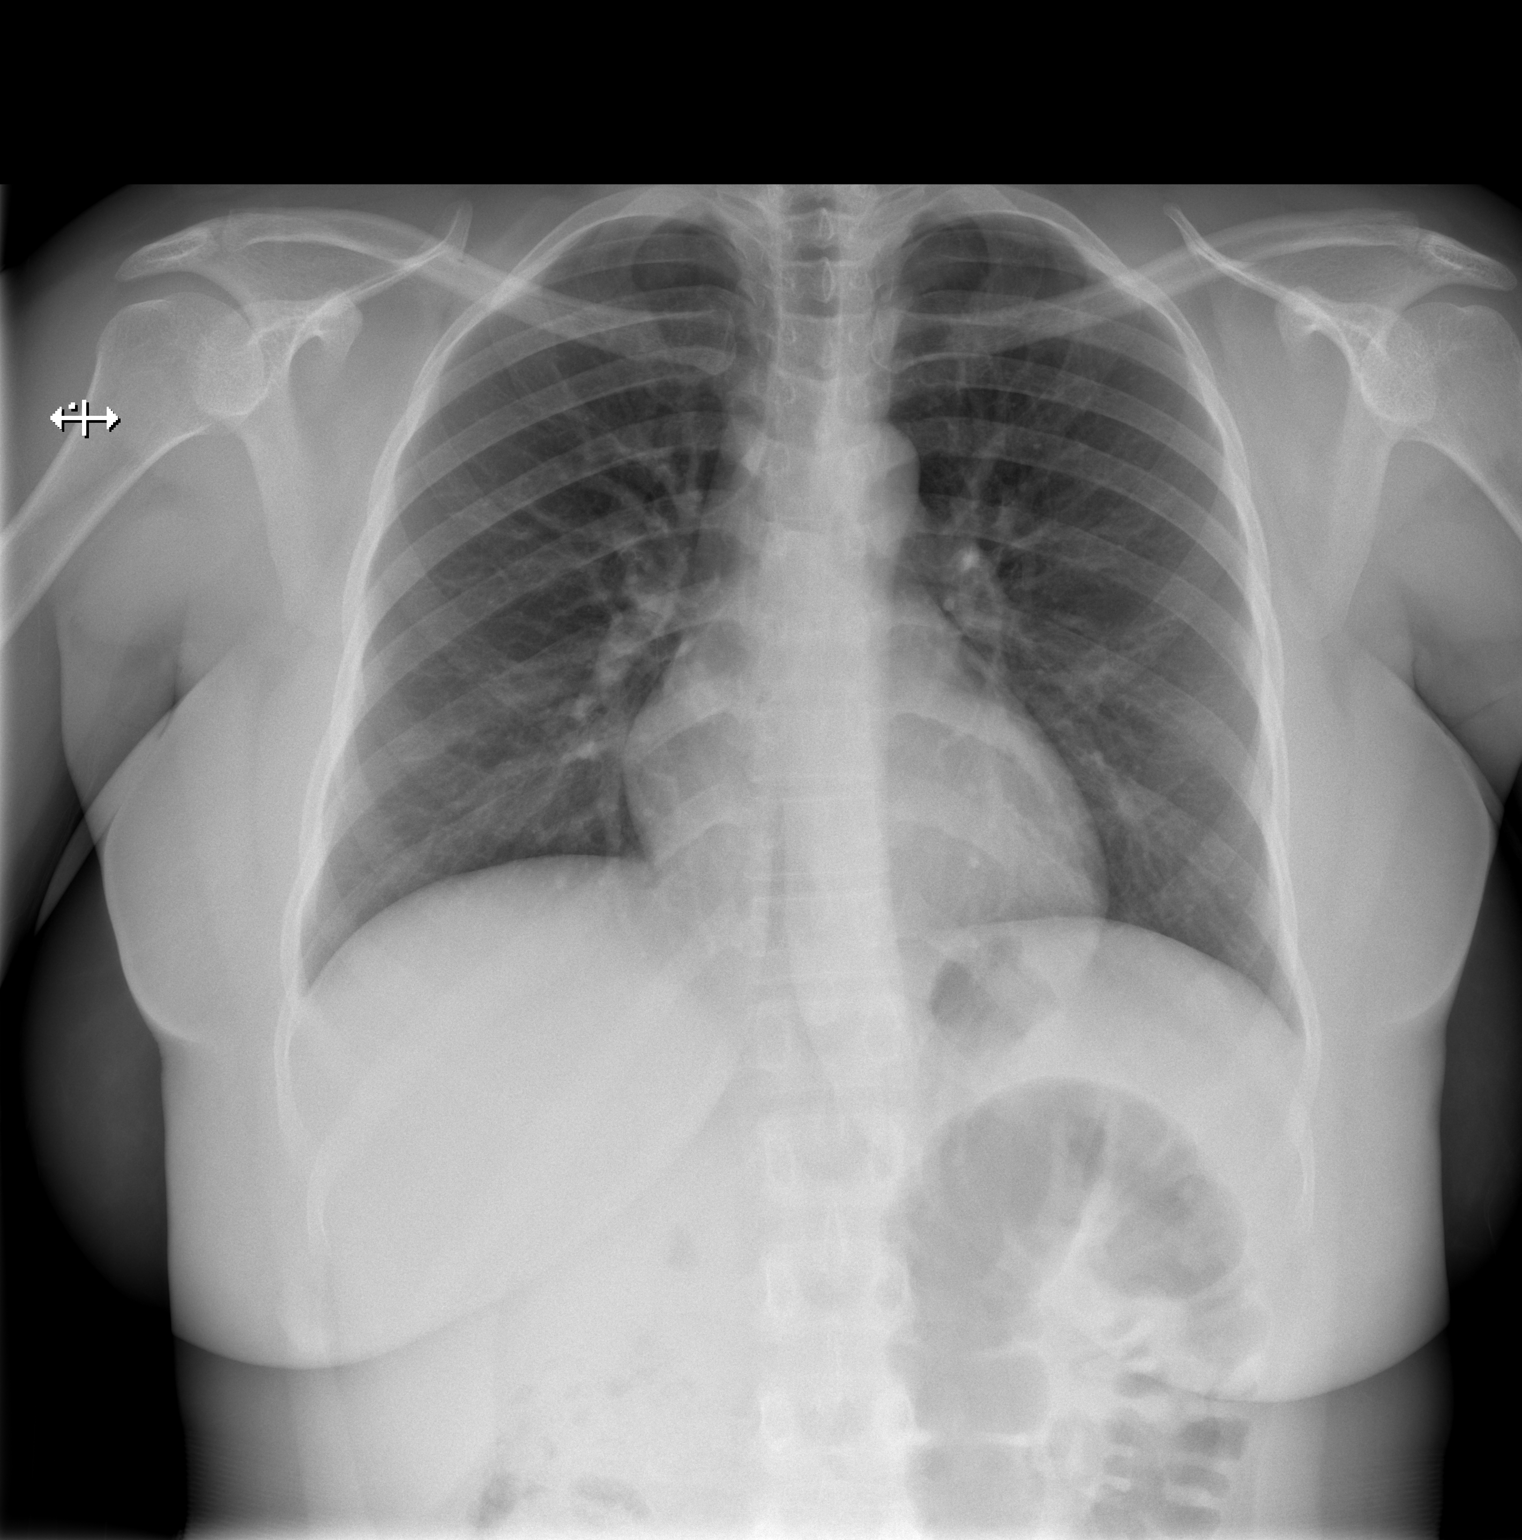

[w chest lat]
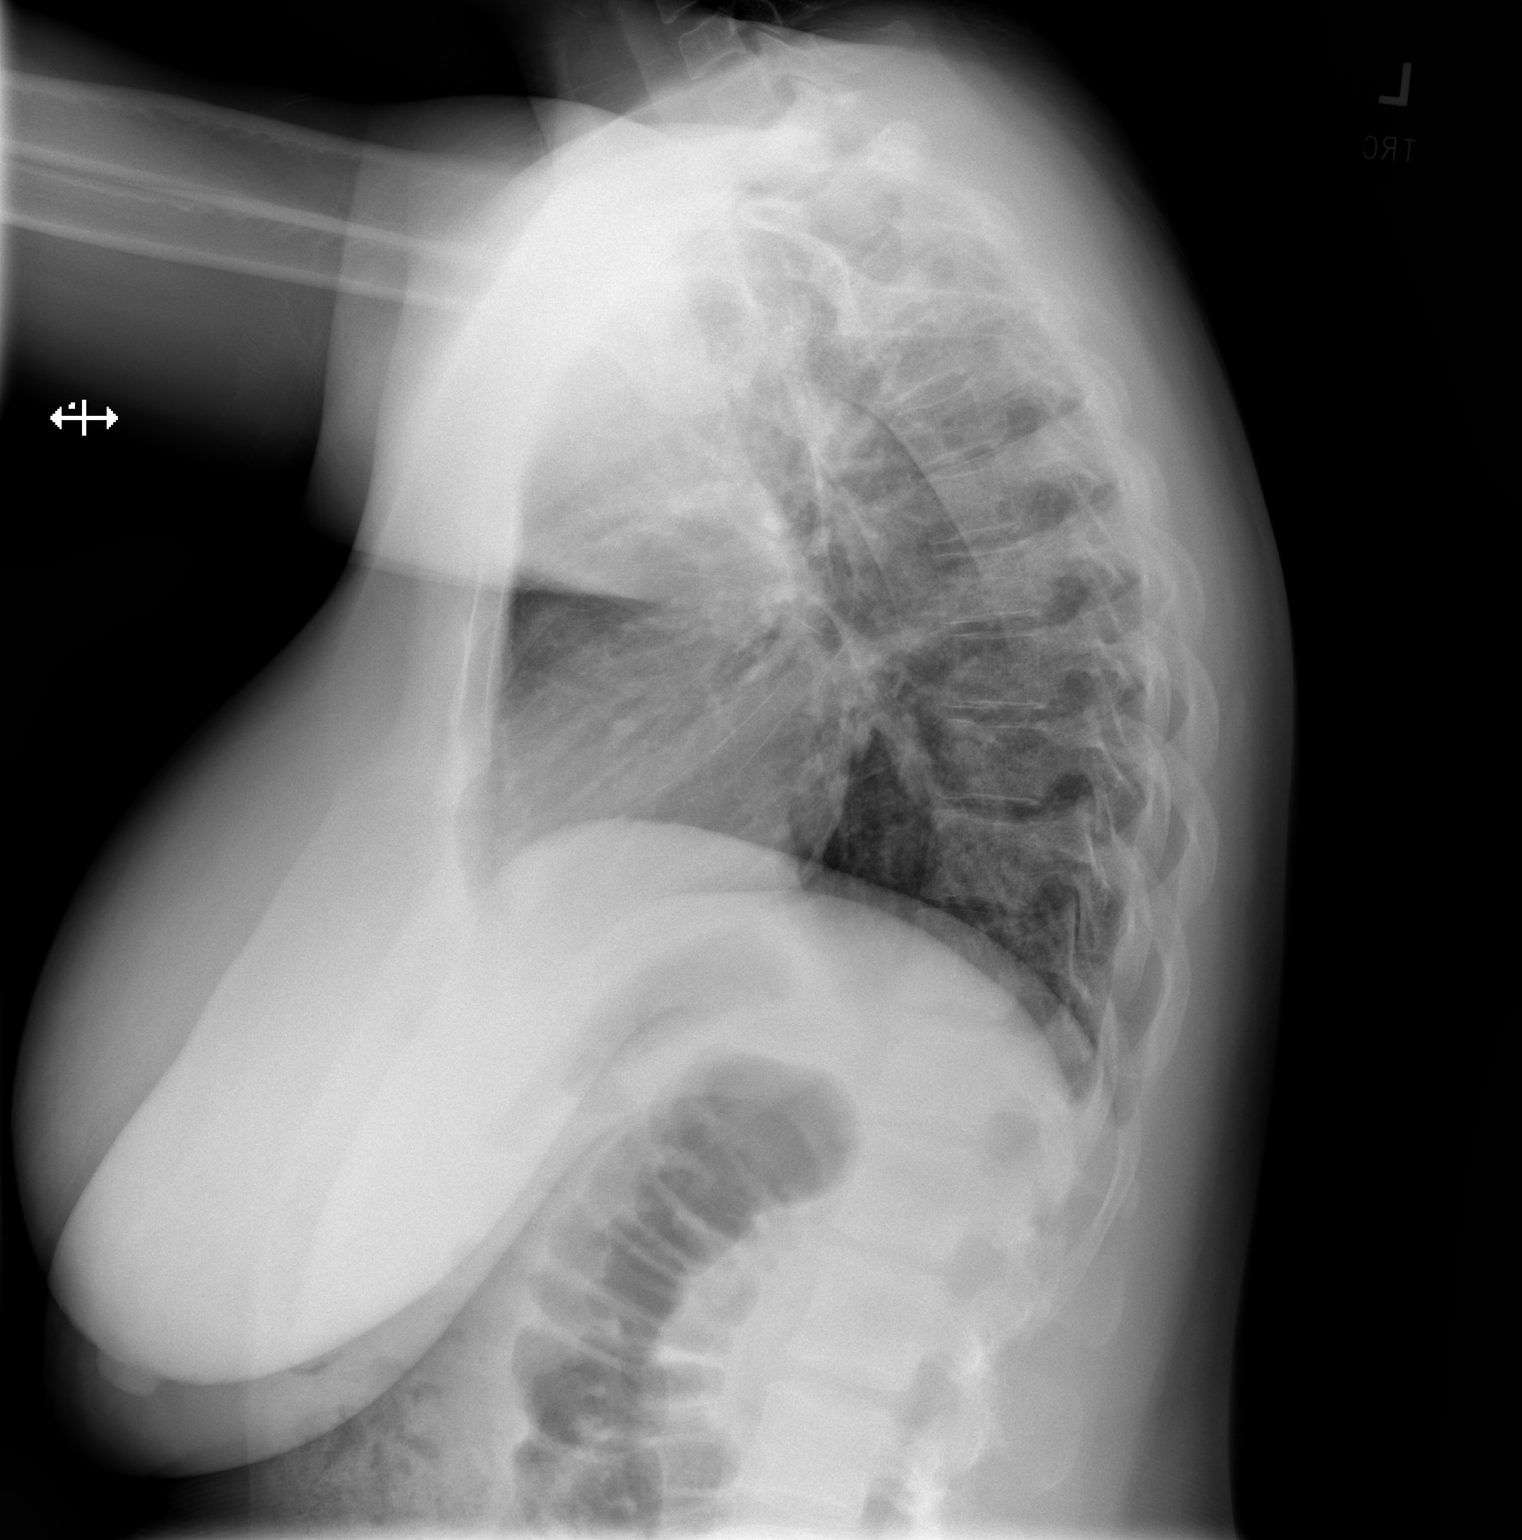

[2 of 2 positions shown; findings below may reference images not displayed]

FINDINGS: The heart size and mediastinal contours are within normal limits.
Both lungs are clear. The visualized skeletal structures are
unremarkable.
IMPRESSION: No active cardiopulmonary disease.

## 2022-08-23 ENCOUNTER — Ambulatory Visit (INDEPENDENT_AMBULATORY_CARE_PROVIDER_SITE_OTHER): Payer: Medicaid Other | Admitting: Family Medicine

## 2022-08-23 ENCOUNTER — Other Ambulatory Visit (HOSPITAL_COMMUNITY)
Admission: RE | Admit: 2022-08-23 | Discharge: 2022-08-23 | Disposition: A | Discharge status: Home / Self Care | Payer: Medicaid Other | Source: Ambulatory Visit | Attending: Family Medicine | Admitting: Family Medicine

## 2022-08-23 ENCOUNTER — Encounter: Payer: Self-pay | Admitting: Family Medicine

## 2022-08-23 VITALS — BP 124/82 | HR 98 | Ht 60.0 in | Wt 184.0 lb

## 2022-08-23 DIAGNOSIS — Z124 Encounter for screening for malignant neoplasm of cervix: Secondary | ICD-10-CM

## 2022-08-23 DIAGNOSIS — E663 Overweight: Secondary | ICD-10-CM

## 2022-08-23 DIAGNOSIS — Z0000 Encounter for general adult medical examination without abnormal findings: Secondary | ICD-10-CM

## 2022-08-23 NOTE — Patient Instructions (Addendum)
It was great to meet you!  Things we discussed today: We did your pap smear. I will send you a MyChart message with the results or call if abnormal.  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  Your referral for weight management was sent to: [facility] at [facility] Address: [address] [place], [place], [place] [address] Phone: [phone] You can try calling for an appointment, but I believe they may have a wait list.  Things to do to keep yourself healthy  - Exercise at least 30-45 minutes a day, 3-4 days a week.  - Eat a diet with lots of fruits and vegetables (5 servings per day). - Avoid sugar sweetened beverages and processed foods whenever possible. - Seatbelts can save your life. Wear them always.  - Safe sex - use a condom to prevent STIs. - Alcohol -  If you drink, do it moderately, less than 2 drinks per day. - Sleep - aim for 7-8 hours nightly. - Limit screen time to no more than 2 hours per day.  - Health Care Power of Attorney. Choose someone to make decisions for you if you are not able.  - Depression is common in our stressful world. If you're feeling down or losing interest in things you normally enjoy, please come in for a visit.  - Violence - If anyone is threatening or hurting you, please call immediately.

## 2022-08-23 NOTE — Progress Notes (Unsigned)
    SUBJECTIVE:   Chief complaint/HPI: annual examination  Lauren Douglas is a 32 y.o. who presents today for an annual exam.   Current concerns: weight, goes to gym 3x/week-- runs, lifts, but wants to lose more weight  Review of systems form notable for ***.   Updated history tabs and problem list ***.   OBJECTIVE:   BP 138/89   Pulse 98   Ht 5' (1.524 m)   Wt 184 lb (83.5 kg)   BMI 35.94 kg/m   124/82 repeat BP  ASSESSMENT/PLAN:   No problem-specific Assessment & Plan notes found for this encounter.    Annual Examination  See AVS for age appropriate recommendations.   PHQ score 2, reviewed and discussed. Blood pressure reviewed and at goal.  Asked about intimate partner violence and patient reports no concerns.  The patient currently uses nothing for contraception. States she's not sexually active currently. Folate recommended as appropriate, minimum of 400 mcg per day.    Considered the following items based upon USPSTF recommendations: HIV testing:  Not ordered.  Negative in 2021, repeat not indicated Hepatitis C:  Not ordered.  Negative in 2021, repeat not indicated Hepatitis B: surface antigen negative in 2021. Will check for immunity today. Syphilis if at high risk:  Not at high risk GC/CT not at high risk and not ordered. Lipid panel (nonfasting or fasting) discussed based upon AHA recommendations and not ordered--normal last year. Reviewed risk factors for latent tuberculosis--history of latent TB, treated in 2023.  Discussed family history, BRCA testing not indicated.  Cervical cancer screening: due for Pap today, cytology + HPV ordered Immunizations ***   Follow up in 1  *** year or sooner if indicated.    Maury Dus, MD [facility]

## 2022-08-24 NOTE — Assessment & Plan Note (Signed)
Referral previously placed to healthy weight & wellness. Patient given contact info in AVS to reach out to them. She will continue working on healthier dietary choices and regular exercise (150 minutes/week) in the meantime.

## 2022-08-29 LAB — CYTOLOGY - PAP
Comment: NEGATIVE
Diagnosis: NEGATIVE
High risk HPV: NEGATIVE

## 2022-09-18 ENCOUNTER — Encounter: Payer: Self-pay | Admitting: Family Medicine

## 2022-09-18 NOTE — Telephone Encounter (Signed)
Patient seen during her daughters visit. LMP 5/27. Has some mild nausea of pregnancy. Took home pregnancy test, which was positive. No bleeding or pain. Termination resources provided.   Terisa Starr, MD  Family Medicine Teaching Service

## 2022-09-30 ENCOUNTER — Encounter (INDEPENDENT_AMBULATORY_CARE_PROVIDER_SITE_OTHER): Payer: Medicaid Other | Admitting: Physician Assistant

## 2022-10-02 ENCOUNTER — Encounter: Payer: Self-pay | Admitting: Family Medicine

## 2022-10-10 ENCOUNTER — Ambulatory Visit: Payer: Medicaid Other | Admitting: Family Medicine

## 2022-10-10 ENCOUNTER — Encounter: Payer: Self-pay | Admitting: Family Medicine

## 2022-10-10 VITALS — BP 120/80 | HR 104 | Wt 178.0 lb

## 2022-10-10 DIAGNOSIS — Z3009 Encounter for other general counseling and advice on contraception: Secondary | ICD-10-CM

## 2022-10-10 DIAGNOSIS — Z3042 Encounter for surveillance of injectable contraceptive: Secondary | ICD-10-CM | POA: Insufficient documentation

## 2022-10-10 LAB — POCT URINE PREGNANCY: Preg Test, Ur: POSITIVE — AB

## 2022-10-10 MED ORDER — MEDROXYPROGESTERONE ACETATE 150 MG/ML IM SUSY
150.0000 mg | PREFILLED_SYRINGE | Freq: Once | INTRAMUSCULAR | Status: AC
  Administered 2022-10-10: 150 mg via INTRAMUSCULAR

## 2022-10-10 NOTE — Progress Notes (Signed)
Depo given in LUOQ today.  Site unremarkable & patient tolerated injection.    Next injection due 12/26/2022-01/09/2023.  Reminder card given.

## 2022-10-10 NOTE — Assessment & Plan Note (Signed)
Patient had pregnancy termination 2 weeks ago and has had unprotected intercourse since then.  Urine pregnancy test today was positive.  Likely due to recent termination.  Cannot rule out new pregnancy.  Patient opted to still receive Depo shot today and follow-up in 2 weeks for repeat urine pregnancy test.  Offered OCPs for 2 weeks prior to the repeat urine test but patient wanted Depo today.

## 2022-10-10 NOTE — Progress Notes (Signed)
    SUBJECTIVE:   CHIEF COMPLAINT / HPI:   Patient had a positive pregnancy test around 7/3.  Had a termination 2 weeks ago.  States that she took the misoprostol and had heavy bleeding for 4 days and then it stopped.  Patient denies any pelvic pain or fevers.  States that she started bleeding a little bit again today and thinks it is her menstrual cycle.  Patient is scheduled to follow-up with the termination clinic tomorrow for a scan to assess for retained products.  Patient is here today because she would like the Depo shot.  Patient notes that she did have unprotected intercourse last week.  Patient would still like Depo shot today, states that if she was pregnant again she would terminate it as well so she would like to proceed with Depo regardless.   PERTINENT  PMH / PSH: abnormal pap - last one 08/23/22 NILM  OBJECTIVE:   BP 120/80   Pulse (!) 104   Wt 178 lb (80.7 kg)   LMP 10/10/2022   SpO2 97%   BMI 34.76 kg/m   Physical Exam Constitutional:      Appearance: Normal appearance. She is normal weight.  Neurological:     Mental Status: She is alert.  Psychiatric:        Mood and Affect: Mood normal.        Behavior: Behavior normal.        Thought Content: Thought content normal.        Judgment: Judgment normal.     ASSESSMENT/PLAN:   Encounter for Depo-Provera contraception Patient had pregnancy termination 2 weeks ago and has had unprotected intercourse since then.  Urine pregnancy test today was positive.  Likely due to recent termination.  Cannot rule out new pregnancy.  Patient opted to still receive Depo shot today and follow-up in 2 weeks for repeat urine pregnancy test.  Offered OCPs for 2 weeks prior to the repeat urine test but patient wanted Depo today.     Para March, DO [facility]

## 2022-10-10 NOTE — Patient Instructions (Signed)
It was great to see you today! Thank you for choosing [facility] for your primary care. Lauren Douglas was seen for depo contraception.  Today we addressed: Depo shot - please f/u in 2 weeks for repeat urine pregnancy test  If you haven't already, sign up for My Chart to have easy access to your labs results, and communication with your primary care physician.  Call the clinic at [phone] if your symptoms worsen or you have any concerns.  You should return to our clinic No follow-ups on file. Please arrive 15 minutes before your appointment to ensure smooth check in process.  We appreciate your efforts in making this happen.  Thank you for allowing me to participate in your care, Para March, DO 10/10/2022, 4:02 PM PGY-1, [facility]

## 2022-10-18 ENCOUNTER — Other Ambulatory Visit: Payer: Self-pay | Admitting: Family Medicine

## 2022-10-18 DIAGNOSIS — Z3200 Encounter for pregnancy test, result unknown: Secondary | ICD-10-CM

## 2022-10-21 ENCOUNTER — Encounter (INDEPENDENT_AMBULATORY_CARE_PROVIDER_SITE_OTHER): Payer: Self-pay | Admitting: Family Medicine

## 2022-10-21 ENCOUNTER — Ambulatory Visit (INDEPENDENT_AMBULATORY_CARE_PROVIDER_SITE_OTHER): Payer: Medicaid Other | Admitting: Family Medicine

## 2022-10-21 VITALS — BP 124/74 | HR 100 | Temp 98.1°F | Ht 61.0 in | Wt 177.0 lb

## 2022-10-21 DIAGNOSIS — E6609 Other obesity due to excess calories: Secondary | ICD-10-CM

## 2022-10-21 DIAGNOSIS — Z6833 Body mass index (BMI) 33.0-33.9, adult: Secondary | ICD-10-CM | POA: Diagnosis not present

## 2022-10-21 DIAGNOSIS — K219 Gastro-esophageal reflux disease without esophagitis: Secondary | ICD-10-CM

## 2022-10-21 NOTE — Assessment & Plan Note (Signed)
No longer on daily H2Blockers or PPIs Heartburn did worsen with weight gain, late night eating and high acid foods and has improved with lifestyle changes  Continue to work on healthy lifestyle changes Look for improvements with further weight loss

## 2022-10-21 NOTE — Progress Notes (Signed)
Office: [phone]  /  Fax: [phone]   Initial Visit  Lauren Douglas was seen in clinic today to evaluate for obesity. She is interested in losing weight to improve overall health and reduce the risk of weight related complications. She presents today to review program treatment options, initial physical assessment, and evaluation.     She was referred by: PCP  When asked what else they would like to accomplish? She states: Improve energy levels and physical activity, Improve appearance, and Improve self-confidence; nurse tech at [employer]  Weight history: pre - baby 138 lb; unremarkable  pregnancies, worse after 2nd baby  When asked how has your weight affected you? She states: Having fatigue  Some associated conditions: GERD  Contributing factors: Family history, Nutritional, Stress, and Reduced physical activity  Weight promoting medications identified: Contraceptives or hormonal therapy  Current nutrition plan: None  Current level of physical activity: None, Running / Jogging, and NEAT  Current or previous pharmacotherapy: Is interested in pharmacotherapy  Response to medication: Never tried medications   Past medical history includes:   Past Medical History:  Diagnosis Date   Depression 03/05/2014   denies   Gestational hypertension 10/22/2016   Pregnancy induced hypertension    at end of first preg     Objective:   BP 124/74   Pulse 100   Temp 98.1 F (36.7 C)   Ht 5\' 1"  (1.549 m)   Wt 177 lb (80.3 kg)   LMP 10/10/2022   SpO2 100%   BMI 33.44 kg/m  She was weighed on the bioimpedance scale: Body mass index is 33.44 kg/m.  Peak Weight:183 , Body Fat%:42.9, Visceral Fat Rating:8, Weight trend over the last 12 months: Unchanged  General:  Alert, oriented and cooperative. Patient is in no acute distress.  Respiratory: Normal respiratory effort, no problems with respiration noted   Gait: able to ambulate independently  Mental Status: Normal  mood and affect. Normal behavior. Normal judgment and thought content.   DIAGNOSTIC DATA REVIEWED:  BMET    Component Value Date/Time   NA 139 12/02/2016 1518   K 4.3 12/02/2016 1518   CL 103 12/02/2016 1518   CO2 23 12/02/2016 1518   GLUCOSE 89 12/02/2016 1518   GLUCOSE 81 10/22/2016 2230   BUN 10 12/02/2016 1518   CREATININE 1.01 (H) 12/02/2016 1518   CREATININE 0.86 03/08/2014 1022   CALCIUM 9.2 12/02/2016 1518   GFRNONAA 78 12/02/2016 1518   GFRAA 89 12/02/2016 1518   Lab Results  Component Value Date   HGBA1C 5.4 09/22/2020   No results found for: "INSULIN" CBC    Component Value Date/Time   WBC 5.6 09/22/2020 0929   WBC 6.5 12/14/2019 0935   RBC 4.78 09/22/2020 0929   RBC 6.07 (H) 12/14/2019 0935   HGB 14.0 09/22/2020 0929   HCT 41.8 09/22/2020 0929   PLT 305 09/22/2020 0929   MCV 87 09/22/2020 0929   MCH 29.3 09/22/2020 0929   MCH 28.0 12/14/2019 0935   MCHC 33.5 09/22/2020 0929   MCHC 33.2 12/14/2019 0935   RDW 11.9 09/22/2020 0929   Iron/TIBC/Ferritin/ %Sat    Component Value Date/Time   FERRITIN 71 09/22/2020 0929   Lipid Panel     Component Value Date/Time   CHOL 112 07/10/2021 1556   TRIG 6 07/10/2021 1556   HDL 41 07/10/2021 1556   CHOLHDL 2.7 07/10/2021 1556   LDLCALC 66 07/10/2021 1556   Hepatic Function Panel     Component Value  Date/Time   PROT 7.6 12/02/2016 1518   ALBUMIN 4.2 12/02/2016 1518   AST 23 12/02/2016 1518   ALT 20 12/02/2016 1518   ALKPHOS 106 12/02/2016 1518   BILITOT 0.2 12/02/2016 1518   BILIDIR 0.09 10/29/2016 1130      Component Value Date/Time   TSH 1.340 05/03/2022 1511   TSH 1.230 12/02/2016 1518     Assessment and Plan:   Class 1 obesity due to excess calories with body mass index (BMI) of 33.0 to 33.9 in adult, unspecified whether serious comorbidity present Assessment & Plan: Reviewed personal weight story, program information and goals and readiness for change.  She is highly motivated to get back  to her pre - baby weight ~140 lb and adopt healthier eating habits with more regular exercise.  Time is a factor between work, school and her 2 young kids.    She will return for fasting labs, EKG and IC testing.    Gastroesophageal reflux disease, unspecified whether esophagitis present Assessment & Plan: No longer on daily H2Blockers or PPIs Heartburn did worsen with weight gain, late night eating and high acid foods and has improved with lifestyle changes  Continue to work on healthy lifestyle changes Look for improvements with further weight loss         Obesity Treatment / Action Plan:  Patient will work on garnering support from family and friends to begin weight loss journey. Will work on eliminating or reducing the presence of highly palatable, calorie dense foods in the home. Will complete provided nutritional and psychosocial assessment questionnaire before the next appointment. Will be scheduled for indirect calorimetry to determine resting energy expenditure in a fasting state.  This will allow [place] to create a reduced calorie, high-protein meal plan to promote loss of fat mass while preserving muscle mass. Will think about ideas on how to incorporate physical activity into their daily routine. Counseled on the health benefits of losing 5%-15% of total body weight. Will work on improving sleep hygiene and trying to obtain at least 7 hours of sleep. Was counseled on nutritional approaches to weight loss and benefits of reducing processed foods and consuming plant-based foods and high quality protein as part of nutritional weight management. Was counseled on pharmacotherapy and role as an adjunct in weight management.   Obesity Education Performed Today:  She was weighed on the bioimpedance scale and results were discussed and documented in the synopsis.  We discussed obesity as a disease and the importance of a more detailed evaluation of all the factors contributing to  the disease.  We discussed the importance of long term lifestyle changes which include nutrition, exercise and behavioral modifications as well as the importance of customizing this to her specific health and social needs.  We discussed the benefits of reaching a healthier weight to alleviate the symptoms of existing conditions and reduce the risks of the biomechanical, metabolic and psychological effects of obesity.  Analucia Cambre appears to be in the action stage of change and states they are ready to start intensive lifestyle modifications and behavioral modifications.  24 minutes was spent today on this visit including the above counseling, pre-visit chart review, and post-visit documentation.  Reviewed by clinician on day of visit: allergies, medications, problem list, medical history, surgical history, family history, social history, and previous encounter notes pertinent to obesity diagnosis.    Seymour Bars, D.O. DABFM, DABOM [facility] [phone] [place], [place] [address] [phone]

## 2022-10-21 NOTE — Assessment & Plan Note (Signed)
Reviewed personal weight story, program information and goals and readiness for change.  She is highly motivated to get back to her pre - baby weight ~140 lb and adopt healthier eating habits with more regular exercise.  Time is a factor between work, school and her 2 young kids.    She will return for fasting labs, EKG and IC testing.

## 2022-10-25 ENCOUNTER — Other Ambulatory Visit (INDEPENDENT_AMBULATORY_CARE_PROVIDER_SITE_OTHER): Payer: Medicaid Other

## 2022-10-25 DIAGNOSIS — Z3200 Encounter for pregnancy test, result unknown: Secondary | ICD-10-CM | POA: Diagnosis present

## 2022-10-25 LAB — POCT URINE PREGNANCY: Preg Test, Ur: NEGATIVE

## 2022-12-31 ENCOUNTER — Ambulatory Visit: Payer: Medicaid Other

## 2022-12-31 DIAGNOSIS — Z3042 Encounter for surveillance of injectable contraceptive: Secondary | ICD-10-CM

## 2022-12-31 MED ORDER — MEDROXYPROGESTERONE ACETATE 150 MG/ML IM SUSP
150.0000 mg | Freq: Once | INTRAMUSCULAR | Status: AC
Start: 2022-12-31 — End: 2022-12-31
  Administered 2022-12-31: 150 mg via INTRAMUSCULAR

## 2022-12-31 NOTE — Progress Notes (Signed)
Patient here today for Depo Provera injection and is within her dates.    Last contraceptive appt was 10/10/2022  Depo given in RUOQ today.  Site unremarkable & patient tolerated injection.    Next injection due 03/18/23-04/01/23.  Patient scheduled next depo appointment for 03/24/2023.  Veronda Prude, RN

## 2023-01-21 ENCOUNTER — Telehealth: Payer: Medicaid Other | Admitting: Physician Assistant

## 2023-01-21 DIAGNOSIS — E66811 Obesity, class 1: Secondary | ICD-10-CM

## 2023-01-21 DIAGNOSIS — E6609 Other obesity due to excess calories: Secondary | ICD-10-CM

## 2023-01-21 NOTE — Progress Notes (Signed)
Patient is employee of [employer] and was trying to access the employee benefit of dietician appointments. Did not mean to schedule with [place]. I verified program information and contact info and provided to the patient.

## 2023-01-21 NOTE — Patient Instructions (Addendum)
  Elexius Tanzi, thank you for joining Piedad Climes, PA-C for today's virtual visit.  While this provider is not your primary care provider (PCP), if your PCP is located in our provider database this encounter information will be shared with them immediately following your visit.   A [facility] MyChart account gives you access to today's visit and all your visits, tests, and labs performed at [facility] " click here if you don't have a [facility] MyChart account or go to [email or web address]  Consent: (Patient) Lauren Douglas provided verbal consent for this virtual visit at the beginning of the encounter.  Current Medications:  Current Outpatient Medications:    ibuprofen (ADVIL) 600 MG tablet, Take 1 tablet (600 mg total) by mouth every 6 (six) hours as needed., Disp: 30 tablet, Rfl: 0   Medications ordered in this encounter:  No orders of the defined types were placed in this encounter.    *If you need refills on other medications prior to your next appointment, please contact your pharmacy*  Follow-Up: Call back or seek an in-person evaluation if the symptoms worsen or if the condition fails to improve as anticipated.  [facility] [phone]  Other Instructions [place],   You can first call [employer] for Nutrition and Weight at [phone] so they can get you set up with appointment with dietician that should be covered for [employer] employees.   Also if you need additional support, please reach back out to your primary care office at [phone]   If you have been instructed to have an in-person evaluation today at a local Urgent Care facility, please use the link below. It will take you to a list of all of our available [facility], including address, phone number and hours of operation. Please do not delay care.  [facility]  If you or a family member do not have a primary care  provider, use the link below to schedule a visit and establish care. When you choose a [facility] primary care physician or advanced practice provider, you gain a long-term partner in health. Find a Primary Care Provider  Learn more about [facility] in-office and virtual care options:  - Get Care Now

## 2023-03-24 ENCOUNTER — Ambulatory Visit (INDEPENDENT_AMBULATORY_CARE_PROVIDER_SITE_OTHER): Payer: Medicaid Other

## 2023-03-24 DIAGNOSIS — Z3042 Encounter for surveillance of injectable contraceptive: Secondary | ICD-10-CM

## 2023-03-24 MED ORDER — MEDROXYPROGESTERONE ACETATE 150 MG/ML IM SUSP
150.0000 mg | Freq: Once | INTRAMUSCULAR | Status: AC
Start: 2023-03-24 — End: 2023-03-24
  Administered 2023-03-24: 150 mg via INTRAMUSCULAR

## 2023-03-24 NOTE — Progress Notes (Signed)
 Patient here today for Depo Provera  injection and is within her dates.    Last contraceptive appt was 10/10/22  Depo given in LUOQ today.  Site unremarkable & patient tolerated injection.    Next injection due 06/09/23-06/23/23.  Reminder card given.    Chiquita JAYSON English, RN

## 2023-06-20 ENCOUNTER — Ambulatory Visit: Payer: Self-pay

## 2023-06-20 DIAGNOSIS — Z3042 Encounter for surveillance of injectable contraceptive: Secondary | ICD-10-CM

## 2023-06-20 MED ORDER — MEDROXYPROGESTERONE ACETATE 150 MG/ML IM SUSP
150.0000 mg | Freq: Once | INTRAMUSCULAR | Status: AC
Start: 2023-06-20 — End: 2023-06-20
  Administered 2023-06-20: 150 mg via INTRAMUSCULAR

## 2023-06-20 NOTE — Progress Notes (Signed)
 Patient here today for Depo Provera injection and is within her dates.     Last contraceptive appt was 10/10/22.   Depo given in RUOQ today.  Site unremarkable & patient tolerated injection.     Next injection due 09/05/2023-09/19/2023.  Reminder card given.

## 2023-09-05 ENCOUNTER — Ambulatory Visit: Payer: Self-pay

## 2023-09-05 DIAGNOSIS — Z3042 Encounter for surveillance of injectable contraceptive: Secondary | ICD-10-CM

## 2023-09-05 MED ORDER — MEDROXYPROGESTERONE ACETATE 150 MG/ML IM SUSP
150.0000 mg | Freq: Once | INTRAMUSCULAR | Status: AC
Start: 2023-09-05 — End: 2023-09-05
  Administered 2023-09-05: 150 mg via INTRAMUSCULAR

## 2023-09-05 NOTE — Progress Notes (Cosign Needed)
 Patient here today for Depo Provera  injection and is within her dates.    Last contraceptive appt was 10/10/22. Scheduled patient with Dr. Fernand Howard on 7/25 for annual visit  Depo given in LUOQ today.  Site unremarkable & patient tolerated injection.    Next injection due 11/21/23-12/05/23.  Reminder card given.    Elsie Halo, RN

## 2023-10-09 ENCOUNTER — Ambulatory Visit: Payer: Self-pay

## 2023-10-09 VITALS — BP 132/72 | HR 85 | Ht 61.0 in | Wt 180.4 lb

## 2023-10-09 DIAGNOSIS — Z0000 Encounter for general adult medical examination without abnormal findings: Secondary | ICD-10-CM

## 2023-10-09 NOTE — Progress Notes (Signed)
 Complete physical exam  Patient: Lauren Douglas   DOB: 1990/08/31   32 y.o. Female  MRN: [id number]  Subjective:    Chief Complaint  Patient presents with   Annual Exam    Lauren Douglas is a 33 y.o. female who presents today for a complete physical exam. She reports consuming a general diet. The patient has a physically strenuous job, but has no regular exercise apart from work.  She generally feels well. She reports sleeping fairly well. She does not have additional problems to discuss today.    Most recent depression screenings:    10/09/2023    3:41 PM 10/10/2022    4:08 PM  PHQ 2/9 Scores  PHQ - 2 Score 1 0  PHQ- 9 Score 3 1    Patient Care Team: Delores Suzann HERO, MD as PCP - General (Family Medicine)   Outpatient Medications Prior to Visit  Medication Sig   ibuprofen  (ADVIL ) 600 MG tablet Take 1 tablet (600 mg total) by mouth every 6 (six) hours as needed.   Prenatal Vit-Fe Fumarate-FA (PRENATAL MULTIVITAMIN) TABS tablet Take 1 tablet by mouth daily at 12 noon.   No facility-administered medications prior to visit.    Review of Systems  Constitutional: Negative.   HENT: Negative.    Eyes: Negative.   Respiratory: Negative.    Cardiovascular: Negative.   Gastrointestinal: Negative.   Genitourinary: Negative.   Musculoskeletal: Negative.   Skin: Negative.   Neurological: Negative.   Endo/Heme/Allergies: Negative.   Psychiatric/Behavioral: Negative.        Objective:     BP 132/72   Pulse 85   Ht 5' 1 (1.549 m)   Wt 180 lb 6.4 oz (81.8 kg)   SpO2 97%   BMI 34.09 kg/m   General: A&O, NAD, pleasant  HEENT: No sign of trauma, EOM grossly intact, PERRLA, no interic sclera, clear nares, clear posterior oropharynx without erythema or exudate, no cervical lymphadenopathy Cardiac: RRR, no m/r/g Respiratory: CTAB, normal WOB, no w/c/r GI: Soft, NTTP, non-distended, no masses, no rebound/guarding, BS present in all 4 quadrants Extremities: NTTP, no  peripheral edema. Neuro: Normal gait, moves all four extremities appropriately, CN II-XII grossly intact Psych: Appropriate mood and affect    Assessment & Plan:    Routine Health Maintenance and Physical Exam  Immunization History  Administered Date(s) Administered   Hepatitis B 09/24/2013, 10/22/2013, 03/23/2014   Influenza,inj,Quad PF,6+ Mos 03/08/2014, 01/10/2015, 02/22/2016, 01/06/2017, 12/22/2018, 11/25/2019, 12/20/2021   MMR 09/24/2013, 10/22/2013   Moderna Sars-Covid-2 Vaccination 03/23/2019, 04/20/2019   PFIZER(Purple Top)SARS-COV-2 Vaccination 02/14/2020   Pfizer Covid-19 Vaccine Bivalent Booster 50yrs & up 02/15/2021   Td 09/24/2013, 10/22/2013   Tdap 03/08/2014, 07/11/2016, 10/07/2019    Health Maintenance  Topic Date Due   HPV VACCINES (1 - 3-dose SCDM series) Never done   COVID-19 Vaccine (5 - 2024-25 season) 11/17/2022   INFLUENZA VACCINE  10/17/2023   DTaP/Tdap/Td (5 - Td or Tdap) 07/12/2026   Cervical Cancer Screening (HPV/Pap Cotest)  08/23/2027   Hepatitis B Vaccines  Completed   Hepatitis C Screening  Completed   HIV Screening  Completed   Meningococcal B Vaccine  Aged Out    Discussed health benefits of physical activity, and encouraged her to engage in regular exercise appropriate for her age and condition.  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 See uploaded pictures.  -Discussed whole foods plant based low fat diet and at least 30 minutes of exercise daily.  Return in about 1 year (around 10/08/2024) for annual physical.     Camie Dixons, DO

## 2023-10-09 NOTE — Patient Instructions (Signed)
 It was wonderful to see you today.  Please bring ALL of your medications with you to every visit.   Today we talked about:  You annual physical.   You got your HPV vaccine today.  Thank you for choosing [facility].   Please call [phone] with any questions about today's appointment.  Please arrive at least 15 minutes prior to your scheduled appointments.   If you had blood work today, I will send you a MyChart message or a letter if results are normal. Otherwise, I will give you a call.   If you had a referral placed, they will call you to set up an appointment. Please give us  a call if you don't hear back in the next 2 weeks.   If you need additional refills before your next appointment, please call your pharmacy first.   You should follow up in our clinic in 1 year for annual follow up.  Camie Dixons, DO Family Medicine

## 2023-10-10 ENCOUNTER — Encounter: Payer: Self-pay | Admitting: Family Medicine

## 2023-10-24 ENCOUNTER — Ambulatory Visit: Payer: Self-pay | Admitting: Dietician

## 2023-10-27 NOTE — Progress Notes (Unsigned)
 Medical Nutrition Therapy  Appointment Start time:  1530  Appointment End time:  1615  Primary concerns today: Pt desires weight reductions to a lower BMI range  Referral diagnosis: BMI 35.0-35.9,adult; [employer] employee visit 1 Preferred learning style:  no preference indicated Learning readiness:  change in progress   NUTRITION ASSESSMENT   Clinical Medical Hx:  Past Medical History:  Diagnosis Date   Depression 03/05/2014   denies   Gestational hypertension 10/22/2016   Pregnancy induced hypertension    at end of first preg    Medications: . Current Outpatient Medications:    ibuprofen  (ADVIL ) 600 MG tablet, Take 1 tablet (600 mg total) by mouth every 6 (six) hours as needed., Disp: 30 tablet, Rfl: 0   Prenatal Vit-Fe Fumarate-FA (PRENATAL MULTIVITAMIN) TABS tablet, Take 1 tablet by mouth daily at 12 noon., Disp: , Rfl:    Labs:  Lab Results  Component Value Date   CHOL 112 07/10/2021   HDL 41 07/10/2021   LDLCALC 66 07/10/2021   TRIG 6 07/10/2021   CHOLHDL 2.7 07/10/2021   Lab Results  Component Value Date   HGBA1C 5.4 09/22/2020   BP Readings from Last 3 Encounters:  10/09/23 132/72  10/21/22 124/74  10/10/22 120/80   Notable Signs/Symptoms:  Wt Readings from Last 3 Encounters:  11/05/23 182 lb 11.2 oz (82.9 kg)  10/09/23 180 lb 6.4 oz (81.8 kg)  10/21/22 177 lb (80.3 kg)   Lifestyle & Dietary Hx Pt presents today with her son. Pt reports she is safe at home and read the questions wrong. Pt reports she does the cooking and shopping. 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  Pt reports she has been engaged in physical activity for the past year. Pt reports her appetite is hard to control. Pt reports using the cafe daily and eating out about once monthly.  Pt reports she has made the following changes including omitting regular soda. All Pt's questions were answered during this encounter.    Estimated daily fluid intake: ~50 oz  daily Supplements: MVI, green tea Sleep: on average hour nightly 7 Stress / self-care: 5 out of 10 /self care includes: exercise, time alone,  Current average weekly physical activity: run 20 minutes daily, weights at gym 3/d/w, yoga once weekly  24-Hr Dietary Recall First Meal: 1 cup 1% milk, 2 slices whole wheat bread with peanut butter or cheese, 1 banana Snack:  none  Second Meal: skips 2/d/w or fried fish, 1 cup rice, water  Snack: none Third Meal: skips or 2/d/w or 2 fish stick, handful of french fries or baked chicken, rice or sweet potatoes or chicken soup tomato, onions, 1 boiled plantain Snack: orange  Beverages: water, 1% milk  NUTRITION DIAGNOSIS  NB-1.1 Food and nutrition-related knowledge deficit As related to no prior nutrition related education .  As evidenced by Pt reports and dietary recall.  NUTRITION INTERVENTION  Nutrition education (E-1) on the following topics:  Fruits & Vegetables: Aim to fill half your plate with a variety of fruits and vegetables. They are rich in vitamins, minerals, and fiber, and can help reduce the risk of chronic diseases. Choose a colorful assortment of fruits and vegetables to ensure you get a wide range of nutrients. Grains and Starches: Make at least half of your grain choices whole grains, such as brown rice, whole wheat bread, and oats. Whole grains provide fiber, which aids in digestion and healthy cholesterol levels. Aim for whole forms  of starchy vegetables such as potatoes, sweet potatoes, beans, peas, and corn, which are fiber rich and provide many vitamins and minerals.  Protein: Incorporate lean sources of protein, such as poultry, fish, beans, nuts, and seeds, into your meals. Protein is essential for building and repairing tissues, staying full, balancing blood sugar, as well as supporting immune function. Dairy: Include low-fat or fat-free dairy products like milk, yogurt, and cheese in your diet. Dairy foods are excellent sources  of calcium and vitamin D, which are crucial for bone health.  Physical Activity: Aim for 60 minutes of physical activity daily. Regular physical activity promotes overall health-including helping to reduce risk for heart disease and diabetes, promoting mental health, and helping us  sleep better.   Handouts Provided Include  Plate planner-[employer] Move Your way-[facility]  Learning Style & Readiness for Change Teaching method utilized: Visual & Auditory  Demonstrated degree of understanding via: Teach Back  Barriers to learning/adherence to lifestyle change: time management, finances  Goals Established by Pt Increase non starchy vegetables Avoid skipping meals   MONITORING & EVALUATION Dietary intake, weekly physical activity  Next Steps  Patient is to return 12/17/2023   Mychart [facility] New Patient Paperwork   11/05/2023  4:16 PM EDT - Fredricka by Patient  Do you follow a special diet? Yes  Does your personal medical history include: None of these  Does you family's medical history include: None of these  Please list any relevant family history information not already mentioned   List any foods or medications you are allergic to. None  Please list any previous surgeries or procedures including dates: No  List medications & supplements including dose and frequency: Multivitamins  Do you have a Living Will or Health Care Power of Attorney? No  Have you had a fall in the past 12 months that has required medical care? No  In the past 2 weeks, have you felt depressed, hopeless, or considered hurting yourself? No  Do you feel safe (free of threats and abuse) in your home environment? No  Within the past 12 months, did you worry that your food would run out before you got money to buy more? No  Within the past 12 months, did the food you bought just not last and you didn't have money to get more? No  What is the last grade level you completed in school? Associate degree  Do you have any urgent  questions for this appointment? No  Are you currently following a meal plan? No  Are you taking your medications as prescribed? No  I authorize [facility] to bill my insurance plan(s) for services rendered. I understand that I am ultimately responsible for charges rendered and for pre-authorization requests. I Acknowledge  When were you diagnosed with Diabetes? N/A  How would you rate your overall health? Fair  How do you feel about having diabetes? Other  Please explain I have never been diagnosed with diabetes  What is your most recent A1c? 0  What was your highest A1c? 0  How often do you test your blood sugar? None  Have you had a dilated eye exam in the past 12 months? No  Have you had a dental exam in the past 12 months? No  How many days per week do you check your feet for sores, red spots, skin cracks? 7  How many days per week do you exercise? 7  How many minutes per day do you exercise? 30  Have you had previous Diabetes Education?  No

## 2023-11-05 ENCOUNTER — Encounter: Payer: Self-pay | Attending: Family Medicine | Admitting: Dietician

## 2023-11-05 VITALS — Wt 182.7 lb

## 2023-11-05 DIAGNOSIS — Z6835 Body mass index (BMI) 35.0-35.9, adult: Secondary | ICD-10-CM | POA: Insufficient documentation

## 2023-12-01 ENCOUNTER — Other Ambulatory Visit (HOSPITAL_COMMUNITY): Payer: Self-pay

## 2023-12-01 MED ORDER — FLUZONE 0.5 ML IM SUSY
0.5000 mL | PREFILLED_SYRINGE | Freq: Once | INTRAMUSCULAR | 0 refills | Status: AC
  Filled 2023-12-01: qty 0.5, 1d supply, fill #0

## 2023-12-02 ENCOUNTER — Other Ambulatory Visit (HOSPITAL_COMMUNITY): Payer: Self-pay

## 2023-12-03 ENCOUNTER — Other Ambulatory Visit (HOSPITAL_COMMUNITY): Payer: Self-pay

## 2023-12-03 MED ORDER — FLUZONE 0.5 ML IM SUSY
0.5000 mL | PREFILLED_SYRINGE | Freq: Once | INTRAMUSCULAR | 0 refills | Status: AC
  Filled 2023-12-03: qty 0.5, 1d supply, fill #0

## 2023-12-11 NOTE — Progress Notes (Deleted)
 Medical Nutrition Therapy  Appointment Start time:  1530  Appointment End time:  1615  Primary concerns today: Pt desires weight reductions to a lower BMI range  Referral diagnosis: BMI 35.0-35.9,adult; [employer] employee visit 1 Preferred learning style:  no preference indicated Learning readiness:  change in progress   NUTRITION ASSESSMENT   Clinical Medical Hx:  Past Medical History:  Diagnosis Date   Depression 03/05/2014   denies   Gestational hypertension 10/22/2016   Pregnancy induced hypertension    at end of first preg    Medications: . Current Outpatient Medications:    ibuprofen  (ADVIL ) 600 MG tablet, Take 1 tablet (600 mg total) by mouth every 6 (six) hours as needed., Disp: 30 tablet, Rfl: 0   Prenatal Vit-Fe Fumarate-FA (PRENATAL MULTIVITAMIN) TABS tablet, Take 1 tablet by mouth daily at 12 noon., Disp: , Rfl:    Labs:  Lab Results  Component Value Date   CHOL 112 07/10/2021   HDL 41 07/10/2021   LDLCALC 66 07/10/2021   TRIG 6 07/10/2021   CHOLHDL 2.7 07/10/2021   Lab Results  Component Value Date   HGBA1C 5.4 09/22/2020   BP Readings from Last 3 Encounters:  10/09/23 132/72  10/21/22 124/74  10/10/22 120/80   Notable Signs/Symptoms:  Wt Readings from Last 3 Encounters:  11/05/23 182 lb 11.2 oz (82.9 kg)  10/09/23 180 lb 6.4 oz (81.8 kg)  10/21/22 177 lb (80.3 kg)   Lifestyle & Dietary Hx Pt presents today with her son. Pt reports she is safe at home and read the questions wrong. Pt reports she does the cooking and shopping. 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  Pt reports she has been engaged in physical activity for the past year. Pt reports her appetite is hard to control. Pt reports using the cafe daily and eating out about once monthly.  Pt reports she has made the following changes including omitting regular soda. All Pt's questions were answered during this encounter.    Estimated daily fluid intake: ~50 oz  daily Supplements: MVI, green tea Sleep: on average hour nightly 7 Stress / self-care: 5 out of 10 /self care includes: exercise, time alone,  Current average weekly physical activity: run 20 minutes daily, weights at gym 3/d/w, yoga once weekly  24-Hr Dietary Recall First Meal: 1 cup 1% milk, 2 slices whole wheat bread with peanut butter or cheese, 1 banana Snack:  none  Second Meal: skips 2/d/w or fried fish, 1 cup rice, water  Snack: none Third Meal: skips or 2/d/w or 2 fish stick, handful of french fries or baked chicken, rice or sweet potatoes or chicken soup tomato, onions, 1 boiled plantain Snack: orange  Beverages: water, 1% milk  NUTRITION DIAGNOSIS  NB-1.1 Food and nutrition-related knowledge deficit As related to no prior nutrition related education .  As evidenced by Pt reports and dietary recall.  NUTRITION INTERVENTION  Nutrition education (E-1) on the following topics:  Fruits & Vegetables: Aim to fill half your plate with a variety of fruits and vegetables. They are rich in vitamins, minerals, and fiber, and can help reduce the risk of chronic diseases. Choose a colorful assortment of fruits and vegetables to ensure you get a wide range of nutrients. Grains and Starches: Make at least half of your grain choices whole grains, such as brown rice, whole wheat bread, and oats. Whole grains provide fiber, which aids in digestion and healthy cholesterol levels. Aim for whole forms  of starchy vegetables such as potatoes, sweet potatoes, beans, peas, and corn, which are fiber rich and provide many vitamins and minerals.  Protein: Incorporate lean sources of protein, such as poultry, fish, beans, nuts, and seeds, into your meals. Protein is essential for building and repairing tissues, staying full, balancing blood sugar, as well as supporting immune function. Dairy: Include low-fat or fat-free dairy products like milk, yogurt, and cheese in your diet. Dairy foods are excellent sources  of calcium and vitamin D, which are crucial for bone health.  Physical Activity: Aim for 60 minutes of physical activity daily. Regular physical activity promotes overall health-including helping to reduce risk for heart disease and diabetes, promoting mental health, and helping us  sleep better.   Handouts Provided Include  Plate planner-[employer] Move Your way-[facility]  Learning Style & Readiness for Change Teaching method utilized: Visual & Auditory  Demonstrated degree of understanding via: Teach Back  Barriers to learning/adherence to lifestyle change: time management, finances  Goals Established by Pt Increase non starchy vegetables Avoid skipping meals   MONITORING & EVALUATION Dietary intake, weekly physical activity  Next Steps  Patient is to return 12/17/2023   No questionnaires on file.

## 2023-12-17 ENCOUNTER — Telehealth: Payer: Self-pay | Admitting: Dietician

## 2023-12-17 DIAGNOSIS — Z6835 Body mass index (BMI) 35.0-35.9, adult: Secondary | ICD-10-CM

## 2024-03-19 ENCOUNTER — Other Ambulatory Visit (HOSPITAL_COMMUNITY): Payer: Self-pay
